# Patient Record
Sex: Male | Born: 1945
Health system: Southern US, Community
[De-identification: ages and names within clinical notes are randomized; demographics above are authoritative.]

## PROBLEM LIST (undated history)

## (undated) DIAGNOSIS — K222 Esophageal obstruction: Secondary | ICD-10-CM

## (undated) DIAGNOSIS — Q893 Situs inversus: Secondary | ICD-10-CM

## (undated) DIAGNOSIS — I499 Cardiac arrhythmia, unspecified: Secondary | ICD-10-CM

## (undated) DIAGNOSIS — K219 Gastro-esophageal reflux disease without esophagitis: Secondary | ICD-10-CM

## (undated) DIAGNOSIS — I4891 Unspecified atrial fibrillation: Secondary | ICD-10-CM

## (undated) DIAGNOSIS — K635 Polyp of colon: Secondary | ICD-10-CM

## (undated) DIAGNOSIS — D369 Benign neoplasm, unspecified site: Secondary | ICD-10-CM

## (undated) DIAGNOSIS — I10 Essential (primary) hypertension: Secondary | ICD-10-CM

## (undated) DIAGNOSIS — E785 Hyperlipidemia, unspecified: Secondary | ICD-10-CM

## (undated) HISTORY — PX: EXTERNAL EAR SURGERY: SHX627

## (undated) HISTORY — PX: TONSILLECTOMY: SUR1361

## (undated) HISTORY — DX: Benign neoplasm, unspecified site: D36.9

## (undated) HISTORY — DX: Unspecified atrial fibrillation: I48.91

## (undated) HISTORY — DX: Esophageal obstruction: K22.2

## (undated) HISTORY — DX: Polyp of colon: K63.5

## (undated) HISTORY — DX: Situs inversus: Q89.3

## (undated) HISTORY — DX: Essential (primary) hypertension: I10

## (undated) HISTORY — DX: Hyperlipidemia, unspecified: E78.5

---

## 2005-03-23 ENCOUNTER — Encounter: Admission: RE | Admit: 2005-03-23 | Discharge: 2005-03-23 | Payer: Self-pay | Admitting: Family Medicine

## 2007-12-10 ENCOUNTER — Ambulatory Visit (HOSPITAL_COMMUNITY): Admission: RE | Admit: 2007-12-10 | Discharge: 2007-12-10 | Payer: Self-pay | Admitting: Pulmonary Disease

## 2007-12-24 ENCOUNTER — Ambulatory Visit (HOSPITAL_COMMUNITY): Admission: RE | Admit: 2007-12-24 | Discharge: 2007-12-24 | Payer: Self-pay | Admitting: Pulmonary Disease

## 2007-12-30 ENCOUNTER — Encounter: Payer: Self-pay | Admitting: Gastroenterology

## 2007-12-30 ENCOUNTER — Ambulatory Visit: Payer: Self-pay | Admitting: Gastroenterology

## 2007-12-30 ENCOUNTER — Ambulatory Visit (HOSPITAL_COMMUNITY): Admission: RE | Admit: 2007-12-30 | Discharge: 2007-12-30 | Payer: Self-pay | Admitting: Gastroenterology

## 2007-12-30 DIAGNOSIS — D369 Benign neoplasm, unspecified site: Secondary | ICD-10-CM

## 2007-12-30 HISTORY — DX: Benign neoplasm, unspecified site: D36.9

## 2008-03-15 ENCOUNTER — Ambulatory Visit: Payer: Self-pay | Admitting: Internal Medicine

## 2008-03-16 ENCOUNTER — Ambulatory Visit: Payer: Self-pay | Admitting: Gastroenterology

## 2008-03-16 ENCOUNTER — Ambulatory Visit (HOSPITAL_COMMUNITY): Admission: RE | Admit: 2008-03-16 | Discharge: 2008-03-16 | Payer: Self-pay | Admitting: Gastroenterology

## 2008-03-16 DIAGNOSIS — K222 Esophageal obstruction: Secondary | ICD-10-CM

## 2008-03-16 HISTORY — DX: Esophageal obstruction: K22.2

## 2008-04-08 ENCOUNTER — Encounter: Payer: Self-pay | Admitting: Gastroenterology

## 2009-10-19 ENCOUNTER — Ambulatory Visit (HOSPITAL_COMMUNITY): Admission: RE | Admit: 2009-10-19 | Discharge: 2009-10-19 | Payer: Self-pay | Admitting: Pulmonary Disease

## 2010-03-05 ENCOUNTER — Encounter: Payer: Self-pay | Admitting: Family Medicine

## 2010-06-27 NOTE — H&P (Signed)
NAME:  Justin Coffey, Justin Coffey NO.:  [id number]   MEDICAL RECORD NO.:  [id number]          PATIENT TYPE:  AMB   LOCATION:  DAY                           FACILITY:  [facility]   PHYSICIAN:  Kassie Mends, M.D.      DATE OF BIRTH:  08-21-1945   DATE OF ADMISSION:  DATE OF DISCHARGE:  LH                              HISTORY & PHYSICAL   Primary gastroenterologist is Dr. Cira Servant.   Primary care physician is Dr. Juanetta Gosling.   CHIEF COMPLAINT:  Globus sensation for 2 months, dysphagia.   HISTORY OF PRESENT ILLNESS:  Justin Coffey a 65 year old Caucasian male.  He has the sense that something is stuck in the back of his throat and  his upper esophagus.  He is having frequent throat clearing. He tells me  he has a lot of mucus.  He was recently diagnosed with bronchitis.  He  has been Prilosec b.i.d. which has made little difference.  He also  sings and tells me that he has had some hoarseness in the last 2 months  as well.  He has a constant a globus sensation.  He denies any heartburn  or indigestion.  He denies any anorexia.  He has had some fatigue.  He  has had solid food dysphagia, especially with foods like chicken. Denies  any problems with liquids.  He feels as if the food gets stuck and  points to his upper esophagus.  He denies any rectal bleeding or melena.  He denies any diarrhea or constipation.   PAST MEDICAL AND SURGICAL HISTORY:  He had a screening colonoscopy by  Dr. Cira Servant on December 30, 2007.  He was found to have a tubular adenoma  removed from the sigmoid colon.  He has a history of situs inversus, ear  graft, tonsillectomy, hypertension, hyperlipidemia, bronchitis.   CURRENT MEDICATIONS:  Omeprazole 20 mg b.i.d., Vytorin 10/20 mg daily,  Cozaar 100 mg daily.   ALLERGIES:  No known drug allergies.   FAMILY HISTORY:  Mother deceased at 36 secondary to lung carcinoma.  Father deceased at 59 secondary to lung carcinoma.  He was a smoker.  He  has two healthy sisters.   There is no known family history of colorectal  carcinoma or lower chronic GI problems.   SOCIAL HISTORY:  Ms. Schwan has been since been married for 40 years.  He has two healthy daughters.  He is retired from [employer].  He  denies any tobacco, alcohol or drug use.   REVIEW OF SYSTEMS:  See HPI.  HEENT: He has had some postnasal drip.  Denies any sore throat.   PHYSICAL EXAMINATION:  VITAL SIGNS: Weight 233 pounds, height 71 inches,  temperature 98.2, blood pressure 140/82, pulse 72.  GENERAL:  He is a well-developed, well-nourished Caucasian male in no  acute distress.  HEENT:  Sclerae are clear, nonicteric.  Conjunctivae are clear.  Oropharynx is pink and moist without any lesions.  NECK:  Supple without adenopathy or thyromegaly.  CHEST: Heart regular rhythm, normal S1, S2.  No murmurs, clicks, rubs or  gallops.  LUNGS:  Clear to auscultation bilaterally.  ABDOMEN:  Positive bowel sounds x4.  No bruits auscultated.  Soft,  nontender, nondistended without palpable masses or hepatosplenomegaly.  No rebound or guarding.  EXTREMITIES:  Without clubbing or edema.   IMPRESSION:  Justin Coffey is a 65 year old Caucasian male with a 15-month  history of globus sensation, frequent throat clearing, solid food  dysphagia.  He is going to require further evaluation to determine if  this can be emanating from gastroesophageal reflux  disease/laryngopharyngeal reflux or a supraesophageal origin.  We do  need to rule out esophageal web ring or stricture as well.   PLAN:  1. EGD with possible esophageal dilatation with Dr. Cira Servant in the near      future. Discussed this procedure including risks and benefits,      which include but are not limited to bleeding, infection,      perforation, drug reaction, and he agrees with plan, and consent      was obtained.  2. Continue b.i.d. Prilosec for now.   Thank you Dr. Juanetta Gosling for allowing [place] to participate in the care of Mr.   Coffey.      Lorenza Burton, N.P.      Kassie Mends, M.D.  Electronically Signed    KJ/MEDQ  D:  03/15/2008  T:  03/15/2008  Job:  [id number]   cc:   Ramon Dredge L. Juanetta Gosling, M.D.  Fax: [phone]

## 2010-06-27 NOTE — Procedures (Signed)
NAME:  Justin Coffey, Justin Coffey NO.:  [id number]   MEDICAL RECORD NO.:  [id number]          PATIENT TYPE:  OUT   LOCATION:  RESP                          FACILITY:  [facility]   PHYSICIAN:  Edward L. Juanetta Gosling, M.D.DATE OF BIRTH:  March 31, 1945   DATE OF PROCEDURE:  DATE OF DISCHARGE:  12/24/2007                            PULMONARY FUNCTION TEST   PULMONARY FUNCTION TEST:  1. Spirometry is normal.  2. Lung volumes are normal.  3. DLCO is normal.  4. Arterial blood gases are normal.      Edward L. Juanetta Gosling, M.D.  Electronically Signed     ELH/MEDQ  D:  12/26/2007  T:  12/26/2007  Job:  [id number]

## 2010-06-27 NOTE — Op Note (Signed)
NAME:  Justin Coffey, BURROUS NO.:  [id number]   MEDICAL RECORD NO.:  [id number]          PATIENT TYPE:  AMB   LOCATION:  DAY                           FACILITY:  [facility]   PHYSICIAN:  Kassie Mends, M.D.      DATE OF BIRTH:  [birth year]   DATE OF PROCEDURE:  DATE OF DISCHARGE:                               OPERATIVE REPORT   PROCEDURE:  Esophagogastroduodenoscopy with Savary dilation to 16 mm.   INDICATION FOR EXAM:  Mr. Feasel is a 65 year old male who has situs  inversus.  He complains of solid dysphagia.  Occasionally, he has to  bring the food back up.   FINDINGS:  1. Distal esophageal ring, which narrowed the lumen to approximately      30 mm.  Otherwise, no evidence of Barrett, mass, erosions, or      ulcerations.  The esophagus was dilated to 16 mm.  2. Normal stomach.  Evidence of situs inversus.  The pylorus is to the      left.  3. Normal duodenal bulb and second portion of the duodenum.   DIAGNOSES:  1. Distal esophageal ring.  2. Situs inversus.   RECOMMENDATIONS:  1. He should continue the omeprazole.  2. No aspirin, NSAIDs, or anticoagulation for 7 days.  3. Followup appointment in 1 month with Dr. Cira Servant regarding his      dysphagia.   MEDICATIONS:  1. Demerol 75 mg IV.  2. Versed 5 mg IV.   PROCEDURE TECHNIQUE:  Physical exam was performed.  Informed consent was  obtained from the patient after explaining the benefits, risks, and  alternatives to the procedure.  The patient was connected to a monitor  and placed in a left lateral position.  Continuous oxygen was provided  by nasal cannula.  IV medicine administered through an indwelling  cannula.  After administration of sedation, the patient's esophagus was  intubated and the scope was advanced under direct visualization to the  second portion of the duodenum.  The scope was removed slowly by  carefully examining the color, texture, anatomy, and integrity of the  mucosa on the way out.  The  patient was  recovered in endoscopy.  Prior to withdrawal of the scope, the Savary  guidewire was introduced.  Each dilator was passed over a wire from 12.8  mm to 16 mm.  The 15-mm and the 16-mm dilator passed with moderate  resistance.  The patient was recovered in endoscopy and discharged home  in satisfactory condition.      Kassie Mends, M.D.  Electronically Signed     SM/MEDQ  D:  03/16/2008  T:  03/16/2008  Job:  [id number]   cc:   Ramon Dredge L. Juanetta Gosling, M.D.  Fax: [phone]

## 2010-06-27 NOTE — Op Note (Signed)
NAME:  Justin Coffey, Justin Coffey NO.:  [id number]   MEDICAL RECORD NO.:  [id number]          PATIENT TYPE:  AMB   LOCATION:  DAY                           FACILITY:  [facility]   PHYSICIAN:  Justin Coffey, M.D.      DATE OF BIRTH:  August 11, 1945   DATE OF PROCEDURE:  12/30/2007  DATE OF DISCHARGE:                               OPERATIVE REPORT   REFERRING PHYSICIAN:  Edward L. Juanetta Gosling, MD   PROCEDURE:  Colonoscopy with cold forceps polypectomy.   INDICATION FOR EXAMINATION:  Justin Coffey is a 65 year old male who  presents with a history of situs inversus.  He reports having a small  polyp 8 years ago in [place].  He does not have any family history of  colon cancer or colon polyps.  He presents for colon cancer screening.   FINDINGS:  1. A 4-mm sessile sigmoid colon polyp removed via cold forceps.      Otherwise, no masses, inflammatory changes, or AVMs seen.  2. Rare sigmoid colon diverticula.  3. Small internal hemorrhoids.  Otherwise, normal retroflexed view of      the rectum.   DIAGNOSES:  1. Small sigmoid colon polyp.  2. Mild sigmoid colon diverticulosis.  3. Small internal hemorrhoids.   RECOMMENDATIONS:  1. Will call Justin Coffey with the results of his biopsies.  If he has      simple adenoma or hyperplastic polyp, then he may have screening      colonoscopy in 5-10 years.  2. He should follow a high-fiber diet.  He was given handout on high-      fiber diet, polyps, diverticulosis, and hemorrhoids.  3. No aspirin, NSAIDs, or anticoagulation for 5 days.   MEDICATIONS:  1. Demerol 75 mg IV.  2. Versed 4 mg IV.   PROCEDURE TECHNIQUE:  Physical exam was performed.  Informed consent was  obtained from the patient explaining the benefits, risks, and  alternatives to the procedure.  The patient was connected to the monitor  and placed in left lateral position.  Continuous oxygen was provided by  nasal cannula and IV medicine administered through an indwelling  cannula.  After administration of sedation and rectal exam, the  patient's rectum was intubated.  The scope was advanced under direct  visualization to the cecum.  The scope was removed slowly by carefully  examining the color, texture, anatomy, and integrity mucosa on the way  out.  The patient was recovered in endoscopy and discharged home in  satisfactory condition.   PATH:  SIMPLE ADENOMA      Justin Coffey, M.D.  Electronically Signed     SM/MEDQ  D:  12/30/2007  T:  12/30/2007  Job:  [id number]   cc:   Justin Coffey, M.D.  Fax: [phone]

## 2010-07-17 ENCOUNTER — Other Ambulatory Visit (HOSPITAL_COMMUNITY): Payer: Self-pay | Admitting: Pulmonary Disease

## 2010-07-17 DIAGNOSIS — R1011 Right upper quadrant pain: Secondary | ICD-10-CM

## 2010-07-18 ENCOUNTER — Ambulatory Visit (HOSPITAL_COMMUNITY)
Admission: RE | Admit: 2010-07-18 | Discharge: 2010-07-18 | Disposition: A | Discharge status: Home / Self Care | Payer: Managed Care, Other (non HMO) | Source: Ambulatory Visit | Attending: Pulmonary Disease | Admitting: Pulmonary Disease

## 2010-07-18 DIAGNOSIS — R1011 Right upper quadrant pain: Secondary | ICD-10-CM

## 2010-07-18 DIAGNOSIS — Q893 Situs inversus: Secondary | ICD-10-CM | POA: Insufficient documentation

## 2010-09-21 ENCOUNTER — Encounter: Payer: Self-pay | Admitting: Urgent Care

## 2010-09-21 ENCOUNTER — Ambulatory Visit (INDEPENDENT_AMBULATORY_CARE_PROVIDER_SITE_OTHER): Payer: Managed Care, Other (non HMO) | Admitting: Urgent Care

## 2010-09-21 DIAGNOSIS — K219 Gastro-esophageal reflux disease without esophagitis: Secondary | ICD-10-CM

## 2010-09-21 DIAGNOSIS — R109 Unspecified abdominal pain: Secondary | ICD-10-CM

## 2010-09-21 NOTE — Assessment & Plan Note (Addendum)
Justin Coffey is a 65 y.o. caucasian male w/ situs inversus totalis with 5 month hx of RUQ abd pain aggravated by eating & exercise.  Hx complicated GERD not on PPI currently.  ABD US->fatty liver & cholelithiasis.  LFTS unknown.  I do not suspect cholelithiasis as the culprit given pain is on opposite side (RUQ).  Differentials include gastritis, poorly controlled GERD, pancreatitis, PUD. Check CBC, LFTS, Amylase, Lipase Resume PPI Consider further imaging via CT scan pending labs & response to PPI.   To ER if severe pain, Offered pain meds, pt declined.

## 2010-09-21 NOTE — Patient Instructions (Signed)
To ER if severe pain Go get labs today  Abdominal Pain Many things can cause belly (abdominal) pain. Most times, the belly pain is not dangerous. The amount of belly pain does not tell how serious the problem may be. Many cases of belly pain can be watched and treated at home. At this time, your doctor believes it is safe for you or your child to go home. HOME CARE  Do not take medicines called laxatives unless told to do so by your doctor. This medicine makes a person go poop (bowel movement).   Only use medicines as told by your doctor.   Limit activity.   Pay attention to the pain.   Has it changed?   Has it moved?   Is it gone?   Eat or drink as told by your doctor. Your doctor will tell you if you or your child should be on a special diet.   Ask questions if there is something you do not understand.  GET HELP RIGHT AWAY IF:  The pain does not go away.   The pain changes and is only in the right or left part of the belly.   You or your child has a temperature by mouth above 101, not controlled by medicine.   Your baby is older than 3 months with a rectal temperature of 102 F (38.9 C) or higher.   Your baby is 75 months old or younger with a rectal temperature of 100.4 F (38 C) or higher.   You or your child keeps throwing up (vomiting) or cannot keep food and fluids down.   There is blood (bright red color) in the poop.   The poop is black and/or looks like tar.  MAKE SURE YOU:  Understand these instructions.   Will watch this condition.   Will get help right away if you or your child is not doing well or gets worse.  Document Released: 07/18/2007 Document Re-Released: 04/25/2009 [facility] Patient Information 2011 [place], [place].

## 2010-09-21 NOTE — Progress Notes (Signed)
Cc to PCP 

## 2010-09-21 NOTE — Progress Notes (Signed)
Primary Care Physician:  Fredirick Maudlin, MD Primary Gastroenterologist:  Dr. Darrick Penna  Chief Complaint  Patient presents with  . Abdominal Pain    RUQ    HPI:  Justin Coffey is a 65 y.o. male here as a work-in today for abdominal pain.  He has hx of SITUS INVERSUS.  5 mo hx RUQ pain.  Intermittent pain, lasts all day long .  Pain almost daily. Worst 3/10.  Pain began after bending over to put on a sock.  Does not wake from sleep.  Worse after eating & movement & exercise.  Occasional nausea.  No vomiting.   Denies any lower GI symptoms including constipation, diarrhea, rectal bleeding, melena or weight loss.  Denies fever or chills.  Wt stable.  Appetite ok.  Denies heartburn or indigestion.  Occasional dysphagia solids.  Better after dilation.  Feels like stitch in side.  Has not tried any meds for this.  ABD [place] 07/18/10->Situs InversusTotalis, Liver/GB in LUQ, cholelithiasis & fatty liver   Past Medical History  Diagnosis Date  . Esophageal ring 03/16/08    s/p esophageal dilation Savary 16mm Dr Darrick Penna   . Colon polyps   . Tubular adenoma 12/30/2007    removed from sigmoid colon  . Situs inversus   . HTN (hypertension)   . Hyperlipemia    Past Surgical History  Procedure Date  . Tonsillectomy   . External ear surgery    Current Outpatient Prescriptions  Medication Sig Dispense Refill  . ezetimibe-simvastatin (VYTORIN) 10-20 MG per tablet Take 1 tablet by mouth at bedtime.        Marland Kitchen losartan (COZAAR) 100 MG tablet Take 100 mg by mouth daily.         Allergies as of 09/21/2010  . (No Known Allergies)   Family History: There is no known family history of colorectal carcinoma , liver disease, or inflammatory bowel disease.  Problem Relation Age of Onset  . Lung cancer Mother   . Lung cancer Father    History   Social History  . Marital Status: Married    Spouse Name: N/A    Number of Children: 2  . Years of Education: N/A   Occupational History  . retired [employer]     Social History Main Topics  . Smoking status: Never Smoker   . Smokeless tobacco: Not on file  . Alcohol Use: No  . Drug Use: No  . Sexually Active: Not on file  Review of Systems: Gen: Denies any fever, chills, sweats, anorexia, fatigue, weakness, malaise, weight loss, and sleep disorder CV: Denies chest pain, angina, palpitations, syncope, orthopnea, PND, peripheral edema, and claudication. Resp: Denies dyspnea at rest, dyspnea with exercise, cough, sputum, wheezing, coughing up blood, and pleurisy. GI: Denies vomiting blood, jaundice, and fecal incontinence.    GU : Denies urinary burning, blood in urine, urinary frequency, urinary hesitancy, nocturnal urination, and urinary incontinence. MS: Denies joint pain, limitation of movement, and swelling, stiffness, low back pain, extremity pain. Denies muscle weakness, cramps, atrophy.  Derm: Denies rash, itching, dry skin, hives, moles, warts, or unhealing ulcers.  Psych: Denies depression, anxiety, memory loss, suicidal ideation, hallucinations, paranoia, and confusion. Heme: Denies bruising, bleeding, and enlarged lymph nodes.  Physical Exam: BP 132/78  Pulse 73  Temp(Src) 98.9 F (37.2 C) (Temporal)  Ht 5\' 11"  (1.803 m)  Wt 232 lb (105.235 kg)  BMI 32.36 kg/m2 General:   Alert,  Well-developed, well-nourished, pleasant and cooperative in NAD Head:  Normocephalic and  atraumatic. Eyes:  Sclera clear, no icterus.   Conjunctiva pink. Ears:  Normal auditory acuity. Nose:  No deformity, discharge,  or lesions. Mouth:  No deformity or lesions, dentition normal. Neck:  Supple; no masses or thyromegaly. Lungs:  Clear throughout to auscultation.   No wheezes, crackles, or rhonchi. No acute distress. Heart:  Regular rate and rhythm; no murmurs, clicks, rubs,  or gallops. Abdomen:  Soft, nontender and nondistended. No masses, hepatosplenomegaly or hernias noted. Normal bowel sounds, without guarding, and without rebound.  Negative  Carnett. Rectal:  Deferred  Msk:  Symmetrical without gross deformities. Normal posture. Pulses:  Normal pulses noted. Extremities:  Without clubbing or edema. Neurologic:  Alert and  oriented x4;  grossly normal neurologically. Skin:  Intact without significant lesions or rashes. Cervical Nodes:  No significant cervical adenopathy. Psych:  Alert and cooperative. Normal mood and affect.

## 2010-09-22 LAB — CBC WITH DIFFERENTIAL/PLATELET
Basophils Absolute: 0 10*3/uL (ref 0.0–0.1)
Basophils Relative: 1 % (ref 0–1)
HCT: 46.7 % (ref 39.0–52.0)
Lymphocytes Relative: 25 % (ref 12–46)
Lymphs Abs: 2 10*3/uL (ref 0.7–4.0)
MCH: 29.9 pg (ref 26.0–34.0)
MCV: 90 fL (ref 78.0–100.0)
Monocytes Absolute: 1.2 10*3/uL — ABNORMAL HIGH (ref 0.1–1.0)
Monocytes Relative: 15 % — ABNORMAL HIGH (ref 3–12)
Neutro Abs: 4.5 10*3/uL (ref 1.7–7.7)
Neutrophils Relative %: 56 % (ref 43–77)
Platelets: 180 10*3/uL (ref 150–400)
RBC: 5.19 MIL/uL (ref 4.22–5.81)

## 2010-09-22 LAB — HEPATIC FUNCTION PANEL
ALT: 27 U/L (ref 0–53)
AST: 25 U/L (ref 0–37)
Albumin: 4.4 g/dL (ref 3.5–5.2)

## 2010-09-22 LAB — CREATININE, SERUM: Creat: 1.2 mg/dL (ref 0.50–1.35)

## 2010-09-22 LAB — AMYLASE: Amylase: 37 U/L (ref 0–105)

## 2010-09-22 NOTE — Progress Notes (Signed)
MOST LIKELY MUSCULOSKELETAL ABD WALL PAIN or functional pain. Waiting on labs. No acute indication for CT Scan

## 2010-09-25 ENCOUNTER — Telehealth: Payer: Self-pay | Admitting: Urgent Care

## 2010-09-25 MED ORDER — OMEPRAZOLE 20 MG PO TBEC: 20.0000 mg | DELAYED_RELEASE_TABLET | Freq: Every day | ORAL | Status: DC

## 2010-09-25 NOTE — Telephone Encounter (Signed)
Message copied by Joselyn Arrow on Mon Sep 25, 2010 10:03 AM ------      Message from: Cloria Spring H      Created: Mon Sep 25, 2010  7:59 AM       Routing back to Lorenza Burton, NP per her request.

## 2010-09-25 NOTE — Telephone Encounter (Signed)
Discussed results w/ pt. Reassured. Agrees w/ plan. Trial omeprazole. Will call if no relief for CT/further work-up.

## 2010-09-28 ENCOUNTER — Ambulatory Visit: Payer: Managed Care, Other (non HMO) | Admitting: Gastroenterology

## 2010-09-28 ENCOUNTER — Telehealth: Payer: Self-pay

## 2010-09-28 NOTE — Telephone Encounter (Signed)
Pt called. He had not received his prescription for Omeprazole. It had been sent to [facility], and I called and spoke with Raquel and cancelled it. Called it to [facility] and give the order to Tammy. Omeprazole 20 mg One tablet daily with 5 refills from Lorenza Burton, NP.

## 2010-11-14 LAB — BLOOD GAS, ARTERIAL

## 2011-06-04 DIAGNOSIS — C4491 Basal cell carcinoma of skin, unspecified: Secondary | ICD-10-CM

## 2011-06-04 HISTORY — DX: Basal cell carcinoma of skin, unspecified: C44.91

## 2011-08-21 ENCOUNTER — Telehealth: Payer: Self-pay | Admitting: Gastroenterology

## 2011-08-21 NOTE — Telephone Encounter (Signed)
Pt's wife called to cancel OV with SF in August. He was going to follow up with PCP.

## 2011-08-21 NOTE — Telephone Encounter (Signed)
I called pt and spoke with his wife. She said that he has had a sore throat, and requesting a Z-Pak. She said he has already taken an antibiotic from PCP. I asked if he was having any difficulty with eating, swallowing etc. She said no. I told her that Dr. Darrick Penna does not prescribe antibiotics for ST that he should see his PCP. She said he has made an appt to see Dr. Darrick Penna in August and he will keep it at this time. She said that he is just concerned because he gets a little hoarse and can't seem to get rid of the ST. They will call the PCP for appt now, and plan on keeping the Aug appt. Here.

## 2011-08-21 NOTE — Telephone Encounter (Signed)
REVIEWED.  

## 2011-08-21 NOTE — Telephone Encounter (Signed)
Pt called to make OV with SF only. OV is 8/14. Pt called back again wanting to know if SF would call in a prescription to [facility] for a Z pack since he has to wait so long to be seen. Please advise and he can be reached at [phone]

## 2011-09-26 ENCOUNTER — Ambulatory Visit: Payer: Managed Care, Other (non HMO) | Admitting: Gastroenterology

## 2012-01-25 ENCOUNTER — Other Ambulatory Visit (HOSPITAL_COMMUNITY): Payer: Self-pay | Admitting: Pulmonary Disease

## 2012-01-25 DIAGNOSIS — R109 Unspecified abdominal pain: Secondary | ICD-10-CM

## 2012-01-30 ENCOUNTER — Ambulatory Visit (HOSPITAL_COMMUNITY)
Admission: RE | Admit: 2012-01-30 | Discharge: 2012-01-30 | Disposition: A | Discharge status: Home / Self Care | Payer: Managed Care, Other (non HMO) | Source: Ambulatory Visit | Attending: Pulmonary Disease | Admitting: Pulmonary Disease

## 2012-01-30 DIAGNOSIS — K8020 Calculus of gallbladder without cholecystitis without obstruction: Secondary | ICD-10-CM | POA: Insufficient documentation

## 2012-01-30 DIAGNOSIS — R109 Unspecified abdominal pain: Secondary | ICD-10-CM

## 2012-01-30 DIAGNOSIS — Q893 Situs inversus: Secondary | ICD-10-CM | POA: Insufficient documentation

## 2012-08-12 ENCOUNTER — Other Ambulatory Visit (HOSPITAL_COMMUNITY): Payer: Self-pay | Admitting: Pulmonary Disease

## 2012-08-12 DIAGNOSIS — T6701XA Heatstroke and sunstroke, initial encounter: Secondary | ICD-10-CM

## 2012-08-13 ENCOUNTER — Ambulatory Visit (HOSPITAL_COMMUNITY)
Admission: RE | Admit: 2012-08-13 | Discharge: 2012-08-13 | Disposition: A | Discharge status: Home / Self Care | Payer: Managed Care, Other (non HMO) | Source: Ambulatory Visit | Attending: Pulmonary Disease | Admitting: Pulmonary Disease

## 2012-08-13 DIAGNOSIS — R51 Headache: Secondary | ICD-10-CM | POA: Insufficient documentation

## 2012-08-13 DIAGNOSIS — R42 Dizziness and giddiness: Secondary | ICD-10-CM | POA: Insufficient documentation

## 2012-08-13 DIAGNOSIS — T6701XA Heatstroke and sunstroke, initial encounter: Secondary | ICD-10-CM

## 2012-09-17 ENCOUNTER — Other Ambulatory Visit: Payer: Self-pay | Admitting: Dermatology

## 2012-12-19 ENCOUNTER — Other Ambulatory Visit (HOSPITAL_COMMUNITY): Payer: Self-pay | Admitting: Pulmonary Disease

## 2012-12-19 ENCOUNTER — Ambulatory Visit (HOSPITAL_COMMUNITY)
Admission: RE | Admit: 2012-12-19 | Discharge: 2012-12-19 | Disposition: A | Discharge status: Home / Self Care | Payer: Managed Care, Other (non HMO) | Source: Ambulatory Visit | Attending: Pulmonary Disease | Admitting: Pulmonary Disease

## 2012-12-19 ENCOUNTER — Other Ambulatory Visit: Payer: Self-pay | Admitting: Orthopedic Surgery

## 2012-12-19 DIAGNOSIS — Q893 Situs inversus: Secondary | ICD-10-CM | POA: Insufficient documentation

## 2012-12-19 DIAGNOSIS — M25519 Pain in unspecified shoulder: Secondary | ICD-10-CM | POA: Insufficient documentation

## 2012-12-19 DIAGNOSIS — R079 Chest pain, unspecified: Secondary | ICD-10-CM

## 2012-12-19 DIAGNOSIS — M25512 Pain in left shoulder: Secondary | ICD-10-CM

## 2012-12-19 DIAGNOSIS — M546 Pain in thoracic spine: Secondary | ICD-10-CM | POA: Insufficient documentation

## 2013-01-06 ENCOUNTER — Encounter (HOSPITAL_BASED_OUTPATIENT_CLINIC_OR_DEPARTMENT_OTHER): Payer: Self-pay | Admitting: *Deleted

## 2013-01-06 NOTE — Progress Notes (Signed)
Had recent lab-will see if ok

## 2013-01-07 NOTE — Progress Notes (Signed)
Dr fitzgerald oked 12/10/12 labs-and waved ekg

## 2013-01-14 ENCOUNTER — Encounter (HOSPITAL_BASED_OUTPATIENT_CLINIC_OR_DEPARTMENT_OTHER)
Admission: RE | Disposition: A | Discharge status: Home / Self Care | Payer: Self-pay | Source: Ambulatory Visit | Attending: Orthopedic Surgery

## 2013-01-14 ENCOUNTER — Ambulatory Visit (HOSPITAL_BASED_OUTPATIENT_CLINIC_OR_DEPARTMENT_OTHER)
Admission: RE | Admit: 2013-01-14 | Discharge: 2013-01-14 | Disposition: A | Discharge status: Home / Self Care | Payer: Medicare Other | Source: Ambulatory Visit | Attending: Orthopedic Surgery | Admitting: Orthopedic Surgery

## 2013-01-14 ENCOUNTER — Encounter (HOSPITAL_BASED_OUTPATIENT_CLINIC_OR_DEPARTMENT_OTHER): Payer: Self-pay | Admitting: *Deleted

## 2013-01-14 ENCOUNTER — Encounter (HOSPITAL_BASED_OUTPATIENT_CLINIC_OR_DEPARTMENT_OTHER): Payer: Medicare Other | Admitting: *Deleted

## 2013-01-14 ENCOUNTER — Ambulatory Visit (HOSPITAL_BASED_OUTPATIENT_CLINIC_OR_DEPARTMENT_OTHER): Payer: Medicare Other | Admitting: *Deleted

## 2013-01-14 DIAGNOSIS — M65839 Other synovitis and tenosynovitis, unspecified forearm: Secondary | ICD-10-CM | POA: Diagnosis not present

## 2013-01-14 DIAGNOSIS — M6530 Trigger finger, unspecified finger: Secondary | ICD-10-CM | POA: Insufficient documentation

## 2013-01-14 DIAGNOSIS — E785 Hyperlipidemia, unspecified: Secondary | ICD-10-CM | POA: Diagnosis not present

## 2013-01-14 DIAGNOSIS — I10 Essential (primary) hypertension: Secondary | ICD-10-CM | POA: Insufficient documentation

## 2013-01-14 HISTORY — PX: TRIGGER FINGER RELEASE: SHX641

## 2013-01-14 SURGERY — RELEASE, A1 PULLEY, FOR TRIGGER FINGER
Anesthesia: Regional | Site: Finger | Laterality: Left

## 2013-01-14 MED ORDER — LIDOCAINE HCL (PF) 0.5 % IJ SOLN
INTRAMUSCULAR | Status: DC | PRN
  Administered 2013-01-14: 35 mL via INTRAVENOUS

## 2013-01-14 MED ORDER — LACTATED RINGERS IV SOLN
INTRAVENOUS | Status: DC
  Administered 2013-01-14: 09:00:00 via INTRAVENOUS

## 2013-01-14 MED ORDER — HYDROMORPHONE HCL PF 1 MG/ML IJ SOLN: 0.2500 mg | INTRAMUSCULAR | Status: DC | PRN

## 2013-01-14 MED ORDER — FENTANYL CITRATE 0.05 MG/ML IJ SOLN: 50.0000 ug | INTRAMUSCULAR | Status: DC | PRN

## 2013-01-14 MED ORDER — MIDAZOLAM HCL 2 MG/2ML IJ SOLN
INTRAMUSCULAR | Status: AC
  Filled 2013-01-14: qty 2

## 2013-01-14 MED ORDER — HYDROCODONE-ACETAMINOPHEN 5-325 MG PO TABS: 1.0000 | ORAL_TABLET | Freq: Four times a day (QID) | ORAL | Status: DC | PRN

## 2013-01-14 MED ORDER — MIDAZOLAM HCL 5 MG/5ML IJ SOLN
INTRAMUSCULAR | Status: DC | PRN
  Administered 2013-01-14: 2 mg via INTRAVENOUS

## 2013-01-14 MED ORDER — OXYCODONE HCL 5 MG PO TABS: 5.0000 mg | ORAL_TABLET | Freq: Once | ORAL | Status: DC | PRN

## 2013-01-14 MED ORDER — BUPIVACAINE HCL (PF) 0.25 % IJ SOLN
INTRAMUSCULAR | Status: AC
  Filled 2013-01-14: qty 30

## 2013-01-14 MED ORDER — LIDOCAINE HCL (CARDIAC) 20 MG/ML IV SOLN
INTRAVENOUS | Status: DC | PRN
  Administered 2013-01-14: 25 mg via INTRAVENOUS

## 2013-01-14 MED ORDER — OXYCODONE HCL 5 MG/5ML PO SOLN: 5.0000 mg | Freq: Once | ORAL | Status: DC | PRN

## 2013-01-14 MED ORDER — CEFAZOLIN SODIUM-DEXTROSE 2-3 GM-% IV SOLR
2.0000 g | INTRAVENOUS | Status: AC
  Administered 2013-01-14: 2 g via INTRAVENOUS

## 2013-01-14 MED ORDER — CHLORHEXIDINE GLUCONATE 4 % EX LIQD: 60.0000 mL | Freq: Once | CUTANEOUS | Status: DC

## 2013-01-14 MED ORDER — FENTANYL CITRATE 0.05 MG/ML IJ SOLN
INTRAMUSCULAR | Status: DC | PRN
  Administered 2013-01-14 (×2): 50 ug via INTRAVENOUS

## 2013-01-14 MED ORDER — MIDAZOLAM HCL 2 MG/2ML IJ SOLN: 1.0000 mg | INTRAMUSCULAR | Status: DC | PRN

## 2013-01-14 MED ORDER — ONDANSETRON HCL 4 MG/2ML IJ SOLN: 4.0000 mg | Freq: Once | INTRAMUSCULAR | Status: DC | PRN

## 2013-01-14 MED ORDER — FENTANYL CITRATE 0.05 MG/ML IJ SOLN
INTRAMUSCULAR | Status: AC
  Filled 2013-01-14: qty 4

## 2013-01-14 MED ORDER — FENTANYL CITRATE 0.05 MG/ML IJ SOLN
INTRAMUSCULAR | Status: AC
  Filled 2013-01-14: qty 2

## 2013-01-14 MED ORDER — PROPOFOL INFUSION 10 MG/ML OPTIME
INTRAVENOUS | Status: DC | PRN
  Administered 2013-01-14: 100 ug/kg/min via INTRAVENOUS

## 2013-01-14 MED ORDER — BUPIVACAINE HCL (PF) 0.25 % IJ SOLN
INTRAMUSCULAR | Status: DC | PRN
  Administered 2013-01-14: 7 mL

## 2013-01-14 MED ORDER — MEPERIDINE HCL 25 MG/ML IJ SOLN: 6.2500 mg | INTRAMUSCULAR | Status: DC | PRN

## 2013-01-14 SURGICAL SUPPLY — 34 items
BANDAGE COBAN STERILE 2 (GAUZE/BANDAGES/DRESSINGS) ×2 IMPLANT
BLADE SURG 15 STRL LF DISP TIS (BLADE) ×1 IMPLANT
BLADE SURG 15 STRL SS (BLADE) ×1
BNDG ESMARK 4X9 LF (GAUZE/BANDAGES/DRESSINGS) IMPLANT
CHLORAPREP W/TINT 26ML (MISCELLANEOUS) ×2 IMPLANT
CORDS BIPOLAR (ELECTRODE) IMPLANT
COVER MAYO STAND STRL (DRAPES) ×2 IMPLANT
COVER TABLE BACK 60X90 (DRAPES) ×2 IMPLANT
CUFF TOURNIQUET SINGLE 18IN (TOURNIQUET CUFF) ×2 IMPLANT
DECANTER SPIKE VIAL GLASS SM (MISCELLANEOUS) IMPLANT
DRAPE EXTREMITY T 121X128X90 (DRAPE) ×2 IMPLANT
DRAPE SURG 17X23 STRL (DRAPES) ×2 IMPLANT
GAUZE XEROFORM 1X8 LF (GAUZE/BANDAGES/DRESSINGS) ×2 IMPLANT
GLOVE BIOGEL PI IND STRL 7.0 (GLOVE) ×1 IMPLANT
GLOVE BIOGEL PI IND STRL 8.5 (GLOVE) ×1 IMPLANT
GLOVE BIOGEL PI INDICATOR 7.0 (GLOVE) ×1
GLOVE BIOGEL PI INDICATOR 8.5 (GLOVE) ×1
GLOVE ECLIPSE 6.5 STRL STRAW (GLOVE) ×2 IMPLANT
GLOVE EXAM NITRILE LRG STRL (GLOVE) ×2 IMPLANT
GLOVE SURG ORTHO 8.0 STRL STRW (GLOVE) ×2 IMPLANT
GOWN BRE IMP PREV XXLGXLNG (GOWN DISPOSABLE) ×2 IMPLANT
GOWN PREVENTION PLUS XLARGE (GOWN DISPOSABLE) ×2 IMPLANT
NEEDLE 27GAX1X1/2 (NEEDLE) ×2 IMPLANT
NS IRRIG 1000ML POUR BTL (IV SOLUTION) ×2 IMPLANT
PACK BASIN DAY SURGERY FS (CUSTOM PROCEDURE TRAY) ×2 IMPLANT
PADDING CAST ABS 4INX4YD NS (CAST SUPPLIES)
PADDING CAST ABS COTTON 4X4 ST (CAST SUPPLIES) IMPLANT
SPONGE GAUZE 4X4 12PLY (GAUZE/BANDAGES/DRESSINGS) ×2 IMPLANT
STOCKINETTE 4X48 STRL (DRAPES) ×2 IMPLANT
SUT VICRYL RAPIDE 4/0 PS 2 (SUTURE) ×2 IMPLANT
SYR BULB 3OZ (MISCELLANEOUS) ×2 IMPLANT
SYR CONTROL 10ML LL (SYRINGE) ×2 IMPLANT
TOWEL OR 17X24 6PK STRL BLUE (TOWEL DISPOSABLE) ×2 IMPLANT
UNDERPAD 30X30 INCONTINENT (UNDERPADS AND DIAPERS) ×2 IMPLANT

## 2013-01-14 NOTE — Transfer of Care (Signed)
Immediate Anesthesia Transfer of Care Note  Patient: Justin Coffey  Procedure(s) Performed: Procedure(s): RELEASE A-1 PULLEY LEFT THUMB,MIDDLE AND RING FINGER (Left)  Patient Location: PACU  Anesthesia Type:MAC  Level of Consciousness: awake, alert , oriented and patient cooperative  Airway & Oxygen Therapy: Patient Spontanous Breathing and Patient connected to face mask oxygen  Post-op Assessment: Report given to PACU RN and Post -op Vital signs reviewed and unstable, Anesthesiologist notified  Post vital signs: Reviewed and stable  Complications: No apparent anesthesia complications

## 2013-01-14 NOTE — Anesthesia Preprocedure Evaluation (Addendum)
Anesthesia Evaluation  Patient identified by MRN, date of birth, ID band Patient awake    Reviewed: Allergy & Precautions, H&P , NPO status , Patient's Chart, lab work & pertinent test results  Airway Mallampati: I TM Distance: >3 FB Neck ROM: Full    Dental   Pulmonary          Cardiovascular hypertension, Pt. on medications     Neuro/Psych    GI/Hepatic GERD-  Controlled and Medicated,  Endo/Other    Renal/GU      Musculoskeletal   Abdominal   Peds  Hematology   Anesthesia Other Findings   Reproductive/Obstetrics                          Anesthesia Physical Anesthesia Plan  ASA: II  Anesthesia Plan: Bier Block   Post-op Pain Management:    Induction: Intravenous  Airway Management Planned: Simple Face Mask  Additional Equipment:   Intra-op Plan:   Post-operative Plan:   Informed Consent: I have reviewed the patients History and Physical, chart, labs and discussed the procedure including the risks, benefits and alternatives for the proposed anesthesia with the patient or authorized representative who has indicated his/her understanding and acceptance.     Plan Discussed with: CRNA and Surgeon  Anesthesia Plan Comments:        Anesthesia Quick Evaluation

## 2013-01-14 NOTE — Anesthesia Postprocedure Evaluation (Signed)
Anesthesia Post Note  Patient: Justin Coffey  Procedure(s) Performed: Procedure(s) (LRB): RELEASE A-1 PULLEY LEFT THUMB,MIDDLE AND RING FINGER (Left)  Anesthesia type: general  Patient location: PACU  Post pain: Pain level controlled  Post assessment: Patient's Cardiovascular Status Stable  Last Vitals:  Filed Vitals:   01/14/13 1031  BP: 160/85  Pulse: 51  Temp: 36.6 C  Resp: 16    Post vital signs: Reviewed and stable  Level of consciousness: sedated  Complications: No apparent anesthesia complications

## 2013-01-14 NOTE — Brief Op Note (Signed)
01/14/2013  9:44 AM  PATIENT:  Stoney Bang  67 y.o. male  PRE-OPERATIVE DIAGNOSIS:  STENOSING TENOSYNOVITIS LEFT THUMB,MIDDLE AND RING FINGER  POST-OPERATIVE DIAGNOSIS:  STENOSING TENOSYNOVITIS LEFT THUMB,MIDDLE AND RING FINGER  PROCEDURE:  Procedure(s): RELEASE A-1 PULLEY LEFT THUMB,MIDDLE AND RING FINGER (Left)  SURGEON:  Surgeon(s) and Role:    * Nicki Reaper, MD - Primary  PHYSICIAN ASSISTANT:   ASSISTANTS: none   ANESTHESIA:   local and regional  EBL:  Total I/O In: 100 [I.V.:100] Out: -   BLOOD ADMINISTERED:none  DRAINS: none   LOCAL MEDICATIONS USED:  BUPIVICAINE   SPECIMEN:  No Specimen  DISPOSITION OF SPECIMEN:  N/A  COUNTS:  YES  TOURNIQUET:   Total Tourniquet Time Documented: Forearm (Left) - 26 minutes Total: Forearm (Left) - 26 minutes   DICTATION: .Other Dictation: Dictation Number [id number]  PLAN OF CARE: Discharge to home after PACU  PATIENT DISPOSITION:  PACU - hemodynamically stable.

## 2013-01-14 NOTE — Op Note (Signed)
Dictation Number [id number]

## 2013-01-14 NOTE — H&P (Signed)
Justin Coffey is a 67 year old left hand dominant male who comes in complaining of catching of his left thumb. This has been going on for approximately one month. It is causing some discomfort. No history of diabetes, thyroid problems, arthritis or gout. He has no history of injury. He states he has a similar problem on his right hand middle and ring fingers. He states it is worse in the morning and improves during the day. He has not had any treatment for it. He has tried taking Aleve without any significant relief. He states it has gotten somewhat better. He has not had anything done to his right hand.  PAST MEDICAL HISTORY: He has no known drug allergies. He is on Omeprazole, Atorvastatin, and Losartan. He has had a tonsillectomy, skin graft to the ear drum.  FAMILY H ISTORY: Positive for high BP.  SOCIAL HISTORY: He does not smoke or drink. He is married.  REVIEW OF SYSTEMS: Positive for high BP, otherwise negative. Justin Coffey is an 67 y.o. male.   Chief Complaint: STS left thumb, middle, ring  HPI: see above  Past Medical History  Diagnosis Date  . Esophageal ring 03/16/08    s/p esophageal dilation Savary 16mm Dr Darrick Penna   . Colon polyps   . Tubular adenoma 12/30/2007    removed from sigmoid colon  . Situs inversus   . HTN (hypertension)   . Hyperlipemia     Past Surgical History  Procedure Laterality Date  . Tonsillectomy    . External ear surgery      Family History  Problem Relation Age of Onset  . Lung cancer Mother   . Lung cancer Father    Social History:  reports that he has never smoked. He does not have any smokeless tobacco history on file. He reports that he does not drink alcohol or use illicit drugs.  Allergies: No Known Allergies  No prescriptions prior to admission    No results found for this or any previous visit (from the past 48 hour(s)).  No results found.   Pertinent items are noted in HPI.  Height 5\' 11"  (1.803 m), weight 225 lb  (102.059 kg).  General appearance: alert, cooperative and appears stated age Head: Normocephalic, without obvious abnormality Neck: no JVD Resp: clear to auscultation bilaterally Cardio: regular rate and rhythm, S1, S2 normal, no murmur, click, rub or gallop GI: soft, non-tender; bowel sounds normal; no masses,  no organomegaly Extremities: extremities normal, atraumatic, no cyanosis or edema Pulses: 2+ and symmetric Skin: Skin color, texture, turgor normal. No rashes or lesions Neurologic: Grossly normal Incision/Wound: na  Assessment/Plan Diagnosis: STS left thumb and ring finger. This has been injected on 2 occasions but continues to trigger. He has also developed triggering of his middle and ring on that hand. In that we would recommend surgical intervention to the thumb we would recommend release of the A-1 pulleys of the middle and ring also. The pre, peri and post op course are discussed along with risks and complications.  He is aware there is no guarantee with surgery, possibility of infection, recurrence, injury to arteries, nerves and tendons, incomplete relief of symptoms and dystrophy.  He is scheduled at his convenience for left thumb, middle and ring finger A-1 pulley releases.  Justin Coffey 01/14/2013, 7:39 AM

## 2013-01-14 NOTE — Anesthesia Procedure Notes (Signed)
Procedure Name: MAC Date/Time: 01/14/2013 9:14 AM Performed by: Nicki Reaper Pre-anesthesia Checklist: Patient identified, Emergency Drugs available, Suction available and Patient being monitored Patient Re-evaluated:Patient Re-evaluated prior to inductionOxygen Delivery Method: Simple face mask Preoxygenation: Pre-oxygenation with 100% oxygen Intubation Type: IV induction

## 2013-01-15 NOTE — Op Note (Signed)
NAMEMarland Coffey  ANDRA, MATSUO NO.:  [id number]  MEDICAL RECORD NO.:  [id number]  LOCATION:                                 FACILITY:  PHYSICIAN:  Cindee Salt, M.D.       DATE OF BIRTH:  24-Nov-1945  DATE OF PROCEDURE:  01/14/2013 DATE OF DISCHARGE:                              OPERATIVE REPORT   PREOPERATIVE DIAGNOSIS:  Stenosing tenosynovitis, left thumb, left middle and left ring fingers.  POSTOPERATIVE DIAGNOSIS:  Stenosing tenosynovitis, left thumb, left middle and left ring fingers.  OPERATION:  Release A1 pulley; left thumb, left middle, and left ring finger.  SURGEON:  Cindee Salt, MD  ANESTHESIA:  Forearm-based IV regional with local infiltration.  HISTORY:  The patient is a 67 year old male with a history of triggering of his thumb and ring fingers.  He has also recently developed triggering of his middle and is desirous having not released also.  He is aware of risks and complications including infection; recurrence of injury to arteries, nerves, tendons, incomplete relief of symptoms, dystrophy.  In the preoperative area, the patient is seen, the extremity marked by both patient and surgeon.  Antibiotic given.  PROCEDURE IN DETAIL:  The patient was brought to the operating room where a forearm-based IV regional anesthetic was carried out without difficulty.  He was prepped using ChloraPrep, supine position with the left arm free.  A 3-minute dry time was allowed.  Time-out taken, confirming the patient and procedure.  After adequate anesthesia was afforded.  An oblique incision was made over the ring finger.  A1 pulley, carried down through subcutaneous tissue.  Bleeders were electrocauterized with bipolar.  Retractors were placed protecting neurovascular bundles.  Significant thickening of the A1 pulley was noted with a moderate tenosynovitis proximally.  The A1 pulley was released on its radial aspect and small incision made centrally in A2 and  partial tenosynovectomy performed proximally.  Finger was placed through a full range motion, no further triggering was noted.  An oblique incision was then made over the middle finger, left hand and carried down through subcutaneous tissue.  Again bleeders were electrocauterized with bipolar.  Dissection carried down to the A1 pulley.  The A1 pulley was released on its radial aspect and again found to be markedly thickened with moderate tenosynovitis proximally.  This was partially resected.  A small incision made centrally in A2, the finger was placed through full range motion and no further triggering was noted.  All retractors were placed to protect neurovascular structures throughout the procedure.  A transverse incision was made over the A1 pulley of the left thumb, and carried down through subcutaneous tissue.  Retractors were placed protecting neurovascular bundles.  Again, thickening of the A1 pulley was noted.  The A1 pulley was released on its radial aspect protecting the oblique pulley distally.  A digital nerve and artery were protected throughout the procedure.  A partial tenosynovectomy was performed with blunt dissection proximally.  The thumb was placed through a full range motion and node was noted and no further triggering was noted.  The wounds were irrigated.  Each closed separately with interrupted 4-0 Vicryl Rapide sutures.  Local  infiltration with 0.25% Marcaine without epinephrine was given at the beginning of the procedure to each of the areas of incision, 8.5 mL was used.  A sterile compressive dressing with fingers and thumb free was applied.  On deflation of the tourniquet, all fingers immediately pinked.  He was taken to the recovery room for observation in satisfactory condition.  He will be discharged home and to return in 1 week on Vicodin.          ______________________________ Cindee Salt, M.D.     GK/MEDQ  D:  01/14/2013  T:  01/15/2013  Job:   [id number]

## 2013-01-19 ENCOUNTER — Encounter (HOSPITAL_BASED_OUTPATIENT_CLINIC_OR_DEPARTMENT_OTHER): Payer: Self-pay | Admitting: Orthopedic Surgery

## 2013-04-03 DIAGNOSIS — H43399 Other vitreous opacities, unspecified eye: Secondary | ICD-10-CM | POA: Diagnosis not present

## 2013-04-03 DIAGNOSIS — H4310 Vitreous hemorrhage, unspecified eye: Secondary | ICD-10-CM | POA: Diagnosis not present

## 2013-04-17 DIAGNOSIS — H43399 Other vitreous opacities, unspecified eye: Secondary | ICD-10-CM | POA: Diagnosis not present

## 2013-04-17 DIAGNOSIS — H4310 Vitreous hemorrhage, unspecified eye: Secondary | ICD-10-CM | POA: Diagnosis not present

## 2013-08-08 DIAGNOSIS — Z6832 Body mass index (BMI) 32.0-32.9, adult: Secondary | ICD-10-CM | POA: Diagnosis not present

## 2013-08-08 DIAGNOSIS — Z23 Encounter for immunization: Secondary | ICD-10-CM | POA: Diagnosis not present

## 2013-08-08 DIAGNOSIS — T148 Other injury of unspecified body region: Secondary | ICD-10-CM | POA: Diagnosis not present

## 2013-11-09 DIAGNOSIS — Z23 Encounter for immunization: Secondary | ICD-10-CM | POA: Diagnosis not present

## 2013-12-20 DIAGNOSIS — J029 Acute pharyngitis, unspecified: Secondary | ICD-10-CM | POA: Diagnosis not present

## 2014-01-11 DIAGNOSIS — E6609 Other obesity due to excess calories: Secondary | ICD-10-CM | POA: Diagnosis not present

## 2014-01-11 DIAGNOSIS — Z6831 Body mass index (BMI) 31.0-31.9, adult: Secondary | ICD-10-CM | POA: Diagnosis not present

## 2014-01-11 DIAGNOSIS — Z23 Encounter for immunization: Secondary | ICD-10-CM | POA: Diagnosis not present

## 2014-01-11 DIAGNOSIS — R7309 Other abnormal glucose: Secondary | ICD-10-CM | POA: Diagnosis not present

## 2014-01-11 DIAGNOSIS — E782 Mixed hyperlipidemia: Secondary | ICD-10-CM | POA: Diagnosis not present

## 2014-01-11 DIAGNOSIS — I10 Essential (primary) hypertension: Secondary | ICD-10-CM | POA: Diagnosis not present

## 2014-01-11 DIAGNOSIS — K219 Gastro-esophageal reflux disease without esophagitis: Secondary | ICD-10-CM | POA: Diagnosis not present

## 2014-01-11 DIAGNOSIS — Z0000 Encounter for general adult medical examination without abnormal findings: Secondary | ICD-10-CM | POA: Diagnosis not present

## 2014-01-11 DIAGNOSIS — Z125 Encounter for screening for malignant neoplasm of prostate: Secondary | ICD-10-CM | POA: Diagnosis not present

## 2014-01-12 DIAGNOSIS — R7309 Other abnormal glucose: Secondary | ICD-10-CM | POA: Diagnosis not present

## 2014-02-02 DIAGNOSIS — J312 Chronic pharyngitis: Secondary | ICD-10-CM | POA: Diagnosis not present

## 2014-02-02 DIAGNOSIS — Z6831 Body mass index (BMI) 31.0-31.9, adult: Secondary | ICD-10-CM | POA: Diagnosis not present

## 2014-03-08 DIAGNOSIS — K449 Diaphragmatic hernia without obstruction or gangrene: Secondary | ICD-10-CM | POA: Diagnosis not present

## 2014-03-08 DIAGNOSIS — K219 Gastro-esophageal reflux disease without esophagitis: Secondary | ICD-10-CM | POA: Diagnosis not present

## 2014-03-08 DIAGNOSIS — K210 Gastro-esophageal reflux disease with esophagitis: Secondary | ICD-10-CM | POA: Diagnosis not present

## 2014-03-08 DIAGNOSIS — D1339 Benign neoplasm of other parts of small intestine: Secondary | ICD-10-CM | POA: Diagnosis not present

## 2014-03-08 DIAGNOSIS — D132 Benign neoplasm of duodenum: Secondary | ICD-10-CM | POA: Diagnosis not present

## 2014-03-08 DIAGNOSIS — K2900 Acute gastritis without bleeding: Secondary | ICD-10-CM | POA: Diagnosis not present

## 2014-03-08 DIAGNOSIS — D131 Benign neoplasm of stomach: Secondary | ICD-10-CM | POA: Diagnosis not present

## 2014-03-08 DIAGNOSIS — K2980 Duodenitis without bleeding: Secondary | ICD-10-CM | POA: Diagnosis not present

## 2014-03-08 DIAGNOSIS — D130 Benign neoplasm of esophagus: Secondary | ICD-10-CM | POA: Diagnosis not present

## 2014-03-17 DIAGNOSIS — R6889 Other general symptoms and signs: Secondary | ICD-10-CM | POA: Diagnosis not present

## 2014-03-17 DIAGNOSIS — K219 Gastro-esophageal reflux disease without esophagitis: Secondary | ICD-10-CM | POA: Diagnosis not present

## 2014-03-17 DIAGNOSIS — J040 Acute laryngitis: Secondary | ICD-10-CM | POA: Diagnosis not present

## 2014-03-17 DIAGNOSIS — Z6831 Body mass index (BMI) 31.0-31.9, adult: Secondary | ICD-10-CM | POA: Diagnosis not present

## 2014-05-26 DIAGNOSIS — L723 Sebaceous cyst: Secondary | ICD-10-CM | POA: Diagnosis not present

## 2014-05-26 DIAGNOSIS — D239 Other benign neoplasm of skin, unspecified: Secondary | ICD-10-CM | POA: Diagnosis not present

## 2014-10-13 DIAGNOSIS — Z1389 Encounter for screening for other disorder: Secondary | ICD-10-CM | POA: Diagnosis not present

## 2014-10-13 DIAGNOSIS — Z6830 Body mass index (BMI) 30.0-30.9, adult: Secondary | ICD-10-CM | POA: Diagnosis not present

## 2014-10-13 DIAGNOSIS — M7550 Bursitis of unspecified shoulder: Secondary | ICD-10-CM | POA: Diagnosis not present

## 2014-10-13 DIAGNOSIS — E6609 Other obesity due to excess calories: Secondary | ICD-10-CM | POA: Diagnosis not present

## 2014-11-29 DIAGNOSIS — Z23 Encounter for immunization: Secondary | ICD-10-CM | POA: Diagnosis not present

## 2014-12-23 DIAGNOSIS — M19011 Primary osteoarthritis, right shoulder: Secondary | ICD-10-CM | POA: Diagnosis not present

## 2014-12-23 DIAGNOSIS — Z6831 Body mass index (BMI) 31.0-31.9, adult: Secondary | ICD-10-CM | POA: Diagnosis not present

## 2014-12-23 DIAGNOSIS — Z1389 Encounter for screening for other disorder: Secondary | ICD-10-CM | POA: Diagnosis not present

## 2014-12-23 DIAGNOSIS — E6609 Other obesity due to excess calories: Secondary | ICD-10-CM | POA: Diagnosis not present

## 2014-12-23 DIAGNOSIS — M25511 Pain in right shoulder: Secondary | ICD-10-CM | POA: Diagnosis not present

## 2014-12-27 DIAGNOSIS — H40013 Open angle with borderline findings, low risk, bilateral: Secondary | ICD-10-CM | POA: Diagnosis not present

## 2014-12-27 DIAGNOSIS — H2513 Age-related nuclear cataract, bilateral: Secondary | ICD-10-CM | POA: Diagnosis not present

## 2014-12-27 DIAGNOSIS — H35372 Puckering of macula, left eye: Secondary | ICD-10-CM | POA: Diagnosis not present

## 2014-12-31 DIAGNOSIS — M24111 Other articular cartilage disorders, right shoulder: Secondary | ICD-10-CM | POA: Diagnosis not present

## 2014-12-31 DIAGNOSIS — M25411 Effusion, right shoulder: Secondary | ICD-10-CM | POA: Diagnosis not present

## 2014-12-31 DIAGNOSIS — M19011 Primary osteoarthritis, right shoulder: Secondary | ICD-10-CM | POA: Diagnosis not present

## 2014-12-31 DIAGNOSIS — M7581 Other shoulder lesions, right shoulder: Secondary | ICD-10-CM | POA: Diagnosis not present

## 2014-12-31 DIAGNOSIS — M65811 Other synovitis and tenosynovitis, right shoulder: Secondary | ICD-10-CM | POA: Diagnosis not present

## 2014-12-31 DIAGNOSIS — M7551 Bursitis of right shoulder: Secondary | ICD-10-CM | POA: Diagnosis not present

## 2015-01-19 DIAGNOSIS — I10 Essential (primary) hypertension: Secondary | ICD-10-CM | POA: Diagnosis not present

## 2015-01-19 DIAGNOSIS — Z0000 Encounter for general adult medical examination without abnormal findings: Secondary | ICD-10-CM | POA: Diagnosis not present

## 2015-01-19 DIAGNOSIS — Z125 Encounter for screening for malignant neoplasm of prostate: Secondary | ICD-10-CM | POA: Diagnosis not present

## 2015-07-20 ENCOUNTER — Other Ambulatory Visit: Payer: Self-pay

## 2015-07-20 ENCOUNTER — Ambulatory Visit (INDEPENDENT_AMBULATORY_CARE_PROVIDER_SITE_OTHER): Payer: Medicare Other | Admitting: Gastroenterology

## 2015-07-20 ENCOUNTER — Encounter: Payer: Self-pay | Admitting: Gastroenterology

## 2015-07-20 VITALS — BP 158/88 | HR 72 | Temp 98.1°F | Ht 71.0 in | Wt 231.0 lb

## 2015-07-20 DIAGNOSIS — Z86010 Personal history of colonic polyps: Secondary | ICD-10-CM

## 2015-07-20 DIAGNOSIS — K219 Gastro-esophageal reflux disease without esophagitis: Secondary | ICD-10-CM | POA: Diagnosis not present

## 2015-07-20 DIAGNOSIS — D369 Benign neoplasm, unspecified site: Secondary | ICD-10-CM

## 2015-07-20 MED ORDER — PANTOPRAZOLE SODIUM 40 MG PO TBEC: 40.0000 mg | DELAYED_RELEASE_TABLET | Freq: Two times a day (BID) | ORAL | Status: DC

## 2015-07-20 MED ORDER — PEG 3350-KCL-NA BICARB-NACL 420 G PO SOLR: 4000.0000 mL | ORAL | Status: DC

## 2015-07-20 NOTE — Patient Instructions (Signed)
1. Stop omeprazole. Start pantoprazole 40 mg 30 minutes before breakfast and 30 minutes before your evening meal each day. Prescription sent to [facility]. 2. Upper endoscopy and colonoscopy as scheduled.

## 2015-07-20 NOTE — Assessment & Plan Note (Signed)
70 year old gentleman with chronic GERD with history of intermittent hoarseness/sore throat with symptoms for the past 3-4 weeks. Has been on omeprazole chronically. Recently started 20 mg twice daily without any added benefit. Denies typical heartburn, dysphagia. Concern for LPR previously. Switch to pantoprazole 40 mg twice daily. Patient requesting upper endoscopy which is reasonable given refractory symptoms. Plan on EGD in the near future.  I have discussed the risks, alternatives, benefits with regards to but not limited to the risk of reaction to medication, bleeding, infection, perforation and the patient is agreeable to proceed. Written consent to be obtained.

## 2015-07-20 NOTE — Progress Notes (Signed)
Primary Care Physician:  Alonza Bogus, MD  Primary Gastroenterologist:  Barney Drain, MD   Chief Complaint  Patient presents with  . Dysphagia  . Follow-up  . Gastroesophageal Reflux    HPI:  Justin Coffey is a 70 y.o. male here to discuss GERD. Patient has a history of chronic GERD, has been on omeprazole for this. No other PPIs. Continues to have flare of symptoms, last episode started 3-4 weeks ago. Recently started omeprazole 20 mg twice a day, had been on 40 mg daily and at some point switch to 20 mg daily. Complains of sore throat. Hoarseness. Loses his voice quickly. Affects his singing. Wakes up the morning with a lot of phlegm. Currently taking omeprazole before breakfast and at bedtime. Denies abdominal pain. Bowel movements are regular. No blood in the stool or melena.   H/O SITUS INVERSUS.  Current Outpatient Prescriptions  Medication Sig Dispense Refill  . aspirin EC 81 MG tablet Take 81 mg by mouth daily.    Marland Kitchen atorvastatin (LIPITOR) 20 MG tablet Take 20 mg by mouth daily.    Marland Kitchen omeprazole (PRILOSEC) 40 MG capsule Take 40 mg by mouth daily.     No current facility-administered medications for this visit.    Allergies as of 07/20/2015  . (No Known Allergies)    Past Medical History  Diagnosis Date  . Esophageal ring 03/16/08    s/p esophageal dilation Savary 65mm Dr [name]   . Colon polyps   . Tubular adenoma 12/30/2007    removed from sigmoid colon  . Situs inversus   . HTN (hypertension)   . Hyperlipemia     Past Surgical History  Procedure Laterality Date  . Tonsillectomy    . External ear surgery    . Trigger finger release Left 01/14/2013    Procedure: RELEASE A-1 PULLEY LEFT THUMB,MIDDLE AND RING FINGER;  Surgeon: Wynonia Sours, MD;  Location: [place];  Service: Orthopedics;  Laterality: Left;    Family History  Problem Relation Age of Onset  . Lung cancer Mother   . Lung cancer Father     Social History   Social History   . Marital Status: Married    Spouse Name: N/A  . Number of Children: 2  . Years of Education: N/A   Occupational History  . retired [employer]    Social History Main Topics  . Smoking status: Never Smoker   . Smokeless tobacco: Not on file  . Alcohol Use: No  . Drug Use: No  . Sexual Activity: Not on file   Other Topics Concern  . Not on file   Social History Narrative      ROS:  General: Negative for anorexia, weight loss, fever, chills, fatigue, weakness. Eyes: Negative for vision changes.  ENT: SEE HPI CV: Negative for chest pain, angina, palpitations, dyspnea on exertion, peripheral edema.  Respiratory: Negative for dyspnea at rest, dyspnea on exertion, cough, sputum, wheezing.  GI: See history of present illness. GU:  Negative for dysuria, hematuria, urinary incontinence, urinary frequency, nocturnal urination.  MS: Negative for joint pain, low back pain.  Derm: Negative for rash or itching.  Neuro: Negative for weakness, abnormal sensation, seizure, frequent headaches, memory loss, confusion.  Psych: Negative for anxiety, depression, suicidal ideation, hallucinations.  Endo: Negative for unusual weight change.  Heme: Negative for bruising or bleeding. Allergy: Negative for rash or hives.    Physical Examination:  BP 158/88 mmHg  Pulse 72  Temp(Src) 98.1 F (  36.7 C)  Ht 5\' 11"  (1.803 m)  Wt 231 lb (104.781 kg)  BMI 32.23 kg/m2   General: Well-nourished, well-developed in no acute distress.  Head: Normocephalic, atraumatic.   Eyes: Conjunctiva pink, no icterus. Mouth: Oropharyngeal mucosa moist and pink , no lesions erythema or exudate. Neck: Supple without thyromegaly, masses, or lymphadenopathy.  Lungs: Clear to auscultation bilaterally.  Heart: Regular rate and rhythm, no murmurs rubs or gallops.  Abdomen: Bowel sounds are normal, nontender, nondistended, no hepatosplenomegaly or masses, no abdominal bruits or    hernia , no rebound or guarding.    Rectal: not performed Extremities: No lower extremity edema. No clubbing or deformities.  Neuro: Alert and oriented x 4 , grossly normal neurologically.  Skin: Warm and dry, no rash or jaundice.   Psych: Alert and cooperative, normal mood and affect.

## 2015-07-20 NOTE — Assessment & Plan Note (Signed)
History of tubular adenoma removed from sigmoid colon in November 2009. Surveillance colonoscopy at this time.  I have discussed the risks, alternatives, benefits with regards to but not limited to the risk of reaction to medication, bleeding, infection, perforation and the patient is agreeable to proceed. Written consent to be obtained.

## 2015-07-20 NOTE — Progress Notes (Signed)
cc'ed to pcp °

## 2015-07-22 ENCOUNTER — Other Ambulatory Visit: Payer: Self-pay

## 2015-08-08 ENCOUNTER — Ambulatory Visit (HOSPITAL_COMMUNITY)
Admission: RE | Admit: 2015-08-08 | Discharge: 2015-08-08 | Disposition: A | Discharge status: Home / Self Care | Payer: Medicare Other | Source: Ambulatory Visit | Attending: Gastroenterology | Admitting: Gastroenterology

## 2015-08-08 ENCOUNTER — Encounter (HOSPITAL_COMMUNITY): Payer: Self-pay | Admitting: *Deleted

## 2015-08-08 ENCOUNTER — Encounter (HOSPITAL_COMMUNITY)
Admission: RE | Disposition: A | Discharge status: Home / Self Care | Payer: Self-pay | Source: Ambulatory Visit | Attending: Gastroenterology

## 2015-08-08 DIAGNOSIS — K2980 Duodenitis without bleeding: Secondary | ICD-10-CM | POA: Diagnosis not present

## 2015-08-08 DIAGNOSIS — K648 Other hemorrhoids: Secondary | ICD-10-CM | POA: Insufficient documentation

## 2015-08-08 DIAGNOSIS — E785 Hyperlipidemia, unspecified: Secondary | ICD-10-CM | POA: Insufficient documentation

## 2015-08-08 DIAGNOSIS — Q893 Situs inversus: Secondary | ICD-10-CM | POA: Insufficient documentation

## 2015-08-08 DIAGNOSIS — D127 Benign neoplasm of rectosigmoid junction: Secondary | ICD-10-CM

## 2015-08-08 DIAGNOSIS — K2970 Gastritis, unspecified, without bleeding: Secondary | ICD-10-CM

## 2015-08-08 DIAGNOSIS — Z79899 Other long term (current) drug therapy: Secondary | ICD-10-CM | POA: Insufficient documentation

## 2015-08-08 DIAGNOSIS — R1013 Epigastric pain: Secondary | ICD-10-CM | POA: Diagnosis not present

## 2015-08-08 DIAGNOSIS — K2950 Unspecified chronic gastritis without bleeding: Secondary | ICD-10-CM | POA: Insufficient documentation

## 2015-08-08 DIAGNOSIS — I10 Essential (primary) hypertension: Secondary | ICD-10-CM | POA: Diagnosis not present

## 2015-08-08 DIAGNOSIS — Z860100 Personal history of colon polyps, unspecified: Secondary | ICD-10-CM | POA: Insufficient documentation

## 2015-08-08 DIAGNOSIS — Z7982 Long term (current) use of aspirin: Secondary | ICD-10-CM | POA: Diagnosis not present

## 2015-08-08 DIAGNOSIS — Z1211 Encounter for screening for malignant neoplasm of colon: Secondary | ICD-10-CM | POA: Diagnosis not present

## 2015-08-08 DIAGNOSIS — K317 Polyp of stomach and duodenum: Secondary | ICD-10-CM | POA: Insufficient documentation

## 2015-08-08 DIAGNOSIS — K219 Gastro-esophageal reflux disease without esophagitis: Secondary | ICD-10-CM | POA: Insufficient documentation

## 2015-08-08 HISTORY — PX: ESOPHAGOGASTRODUODENOSCOPY: SHX5428

## 2015-08-08 HISTORY — PX: COLONOSCOPY: SHX5424

## 2015-08-08 SURGERY — COLONOSCOPY
Anesthesia: Moderate Sedation

## 2015-08-08 MED ORDER — MEPERIDINE HCL 100 MG/ML IJ SOLN
INTRAMUSCULAR | Status: AC
  Filled 2015-08-08: qty 2

## 2015-08-08 MED ORDER — LIDOCAINE VISCOUS 2 % MT SOLN
OROMUCOSAL | Status: AC
  Filled 2015-08-08: qty 15

## 2015-08-08 MED ORDER — MEPERIDINE HCL 100 MG/ML IJ SOLN
INTRAMUSCULAR | Status: DC | PRN
  Administered 2015-08-08 (×4): 25 mg via INTRAVENOUS

## 2015-08-08 MED ORDER — MIDAZOLAM HCL 5 MG/5ML IJ SOLN
INTRAMUSCULAR | Status: AC
  Filled 2015-08-08: qty 10

## 2015-08-08 MED ORDER — MIDAZOLAM HCL 5 MG/5ML IJ SOLN
INTRAMUSCULAR | Status: DC | PRN
  Administered 2015-08-08 (×3): 2 mg via INTRAVENOUS

## 2015-08-08 MED ORDER — SODIUM CHLORIDE 0.9 % IV SOLN
INTRAVENOUS | Status: DC
  Administered 2015-08-08: 1000 mL via INTRAVENOUS

## 2015-08-08 MED ORDER — STERILE WATER FOR IRRIGATION IR SOLN
Status: DC | PRN
  Administered 2015-08-08: 08:00:00

## 2015-08-08 MED ORDER — LIDOCAINE VISCOUS 2 % MT SOLN
OROMUCOSAL | Status: DC | PRN
  Administered 2015-08-08: 3 mL via OROMUCOSAL

## 2015-08-08 NOTE — H&P (Signed)
  Primary Care Physician:  Alonza Bogus, MD Primary Gastroenterologist:  Dr. [name]  Pre-Procedure History & Physical: HPI:  Justin Coffey is a 70 y.o. male here for PERSONAL HISTORY OF POLYPS.  Past Medical History  Diagnosis Date  . Esophageal ring 03/16/08    s/p esophageal dilation Savary 65mm Dr [name]   . Colon polyps   . Tubular adenoma 12/30/2007    removed from sigmoid colon  . Situs inversus   . HTN (hypertension)   . Hyperlipemia     Past Surgical History  Procedure Laterality Date  . Tonsillectomy    . External ear surgery    . Trigger finger release Left 01/14/2013    Procedure: RELEASE A-1 PULLEY LEFT THUMB,MIDDLE AND RING FINGER;  Surgeon: Wynonia Sours, MD;  Location: [place];  Service: Orthopedics;  Laterality: Left;    Prior to Admission medications   Medication Sig Start Date End Date Taking? Authorizing Provider  aspirin EC 81 MG tablet Take 81 mg by mouth daily.   Yes Historical Provider, MD  atorvastatin (LIPITOR) 20 MG tablet Take 20 mg by mouth daily.   Yes Historical Provider, MD  pantoprazole (PROTONIX) 40 MG tablet Take 1 tablet (40 mg total) by mouth 2 (two) times daily before a meal. 07/20/15  Yes Mahala Menghini, PA-C  polyethylene glycol-electrolytes (TRILYTE) 420 g solution Take 4,000 mLs by mouth as directed. 07/20/15  Yes Danie Binder, MD    Allergies as of 07/20/2015  . (No Known Allergies)    Family History  Problem Relation Age of Onset  . Lung cancer Mother   . Lung cancer Father     Social History   Social History  . Marital Status: Married    Spouse Name: N/A  . Number of Children: 2  . Years of Education: N/A   Occupational History  . retired [employer]    Social History Main Topics  . Smoking status: Never Smoker   . Smokeless tobacco: Not on file  . Alcohol Use: No  . Drug Use: No  . Sexual Activity: Not on file   Other Topics Concern  . Not on file   Social History Narrative    Review of  Systems: See HPI, otherwise negative ROS   Physical Exam: There were no vitals taken for this visit. General:   Alert,  pleasant and cooperative in NAD Head:  Normocephalic and atraumatic. Neck:  Supple; Lungs:  Clear throughout to auscultation.    Heart:  Regular rate and rhythm. Abdomen:  Soft, nontender and nondistended. Normal bowel sounds, without guarding, and without rebound.   Neurologic:  Alert and  oriented x4;  grossly normal neurologically.  Impression/Plan:   PERSONAL HISTORY OF POLYPS/dyspepsia  Plan:  1. TCS/EGD TODAY

## 2015-08-08 NOTE — Discharge Instructions (Signed)
You have moderate internal hemorrhoids, and HAD 1 small polyp removed. You have mild gastritis.I biopsied your stomach.   DRINK WATER TO KEEP YOUR URINE LIGHT YELLOW.  FOLLOW A HIGH FIBER/LOW FAT DIET. AVOID ITEMS THAT CAUSE BLOATING. SEE INFO BELOW.  AVOID ITEMS THAT TRIGGER GASTRITIS. See info below.  CONTINUE PROTONIX. TAKE 30 MINUTES PRIOR TO MEALS TWICE DAILY.  YOUR BIOPSY RESULTS WILL BE AVAILABLE IN MY CHART JUN 28 AND MY OFFICE WILL CONTACT YOU IN 10-14 DAYS WITH YOUR RESULTS.   FOLLOW UP IN 4 MOS.   Next colonoscopy in 5-10 years.   ENDOSCOPY Care After Read the instructions outlined below and refer to this sheet in the next week. These discharge instructions provide you with general information on caring for yourself after you leave the hospital. While your treatment has been planned according to the most current medical practices available, unavoidable complications occasionally occur. If you have any problems or questions after discharge, call DR. Yvetta Drotar, [phone].  ACTIVITY  You may resume your regular activity, but move at a slower pace for the next 24 hours.   Take frequent rest periods for the next 24 hours.   Walking will help get rid of the air and reduce the bloated feeling in your belly (abdomen).   No driving for 24 hours (because of the medicine (anesthesia) used during the test).   You may shower.   Do not sign any important legal documents or operate any machinery for 24 hours (because of the anesthesia used during the test).    NUTRITION  Drink plenty of fluids.   You may resume your normal diet as instructed by your doctor.   Begin with a light meal and progress to your normal diet. Heavy or fried foods are harder to digest and may make you feel sick to your stomach (nauseated).   Avoid alcoholic beverages for 24 hours or as instructed.    MEDICATIONS  You may resume your normal medications.   WHAT YOU CAN EXPECT TODAY  Some  feelings of bloating in the abdomen.   Passage of more gas than usual.   Spotting of blood in your stool or on the toilet paper  .  IF YOU HAD POLYPS REMOVED DURING THE ENDOSCOPY:  Eat a soft diet IF YOU HAVE NAUSEA, BLOATING, ABDOMINAL PAIN, OR VOMITING.    FINDING OUT THE RESULTS OF YOUR TEST Not all test results are available during your visit. DR. [name] WILL CALL YOU WITHIN 14 DAYS OF YOUR PROCEDUE WITH YOUR RESULTS. Do not assume everything is normal if you have not heard from DR. Batool Majid, CALL HER OFFICE AT [phone].  SEEK IMMEDIATE MEDICAL ATTENTION AND CALL THE OFFICE: [phone] IF:  You have more than a spotting of blood in your stool.   Your belly is swollen (abdominal distention).   You are nauseated or vomiting.   You have a temperature over 101F.   You have abdominal pain or discomfort that is severe or gets worse throughout the day.  Polyps, Colon  A polyp is extra tissue that grows inside your body. Colon polyps grow in the large intestine. The large intestine, also called the colon, is part of your digestive system. It is a long, hollow tube at the end of your digestive tract where your body makes and stores stool. Most polyps are not dangerous. They are benign. This means they are not cancerous. But over time, some types of polyps can turn into cancer. Polyps that are smaller than a  pea are usually not harmful. But larger polyps could someday become or may already be cancerous. To be safe, doctors remove all polyps and test them.   PREVENTION There is not one sure way to prevent polyps. You might be able to lower your risk of getting them if you:  Eat more fruits and vegetables and less fatty food.   Do not smoke.   Avoid alcohol.   Exercise every day.   Lose weight if you are overweight.   Eating more calcium and folate can also lower your risk of getting polyps. Some foods that are rich in calcium are milk, cheese, and broccoli. Some foods that  are rich in folate are chickpeas, kidney beans, and spinach.    Gastritis  Gastritis is an inflammation (the body's way of reacting to injury and/or infection) of the stomach. It is often caused by viral or bacterial (germ) infections. It can also be caused BY ASPIRIN, BC/GOODY POWDER'S, (IBUPROFEN) MOTRIN, OR ALEVE (NAPROXEN), chemicals (including alcohol), SPICY FOODS, and medications. This illness may be associated with generalized malaise (feeling tired, not well), UPPER ABDOMINAL STOMACH cramps, and fever. One common bacterial cause of gastritis is an organism known as H. Pylori. This can be treated with antibiotics.    High-Fiber Diet A high-fiber diet changes your normal diet to include more whole grains, legumes, fruits, and vegetables. Changes in the diet involve replacing refined carbohydrates with unrefined foods. The calorie level of the diet is essentially unchanged. The Dietary Reference Intake (recommended amount) for adult males is 38 grams per day. For adult females, it is 25 grams per day. Pregnant and lactating women should consume 28 grams of fiber per day. Fiber is the intact part of a plant that is not broken down during digestion. Functional fiber is fiber that has been isolated from the plant to provide a beneficial effect in the body. PURPOSE  Increase stool bulk.   Ease and regulate bowel movements.   Lower cholesterol.  REDUCE RISK OF COLON CANCER  INDICATIONS THAT YOU NEED MORE FIBER  Constipation and hemorrhoids.   Uncomplicated diverticulosis (intestine condition) and irritable bowel syndrome.   Weight management.   As a protective measure against hardening of the arteries (atherosclerosis), diabetes, and cancer.   GUIDELINES FOR INCREASING FIBER IN THE DIET  Start adding fiber to the diet slowly. A gradual increase of about 5 more grams (2 slices of whole-wheat bread, 2 servings of most fruits or vegetables, or 1 bowl of high-fiber cereal) per day is  best. Too rapid an increase in fiber may result in constipation, flatulence, and bloating.   Drink enough water and fluids to keep your urine clear or pale yellow. Water, juice, or caffeine-free drinks are recommended. Not drinking enough fluid may cause constipation.   Eat a variety of high-fiber foods rather than one type of fiber.   Try to increase your intake of fiber through using high-fiber foods rather than fiber pills or supplements that contain small amounts of fiber.   The goal is to change the types of food eaten. Do not supplement your present diet with high-fiber foods, but replace foods in your present diet.   INCLUDE A VARIETY OF FIBER SOURCES  Replace refined and processed grains with whole grains, canned fruits with fresh fruits, and incorporate other fiber sources. White rice, white breads, and most bakery goods contain little or no fiber.   Brown whole-grain rice, buckwheat oats, and many fruits and vegetables are all good sources of fiber.  These include: broccoli, Brussels sprouts, cabbage, cauliflower, beets, sweet potatoes, white potatoes (skin on), carrots, tomatoes, eggplant, squash, berries, fresh fruits, and dried fruits.   Cereals appear to be the richest source of fiber. Cereal fiber is found in whole grains and bran. Bran is the fiber-rich outer coat of cereal grain, which is largely removed in refining. In whole-grain cereals, the bran remains. In breakfast cereals, the largest amount of fiber is found in those with "bran" in their names. The fiber content is sometimes indicated on the label.   You may need to include additional fruits and vegetables each day.   In baking, for 1 cup white flour, you may use the following substitutions:   1 cup whole-wheat flour minus 2 tablespoons.   1/2 cup white flour plus 1/2 cup whole-wheat flour.   Low-Fat Diet BREADS, CEREALS, PASTA, RICE, DRIED PEAS, AND BEANS These products are high in carbohydrates and most are low  in fat. Therefore, they can be increased in the diet as substitutes for fatty foods. They too, however, contain calories and should not be eaten in excess. Cereals can be eaten for snacks as well as for breakfast.  Include foods that contain fiber (fruits, vegetables, whole grains, and legumes). Research shows that fiber may lower blood cholesterol levels, especially the water-soluble fiber found in fruits, vegetables, oat products, and legumes. FRUITS AND VEGETABLES It is good to eat fruits and vegetables. Besides being sources of fiber, both are rich in vitamins and some minerals. They help you get the daily allowances of these nutrients. Fruits and vegetables can be used for snacks and desserts. MEATS Limit lean meat, chicken, [place], and fish to no more than 6 ounces per day. Beef, Pork, and Lamb Use lean cuts of beef, pork, and lamb. Lean cuts include:  Extra-lean ground beef.  Arm roast.  Sirloin tip.  Center-cut ham.  Round steak.  Loin chops.  Rump roast.  Tenderloin.  Trim all fat off the outside of meats before cooking. It is not necessary to severely decrease the intake of red meat, but lean choices should be made. Lean meat is rich in protein and contains a highly absorbable form of iron. Premenopausal women, in particular, should avoid reducing lean red meat because this could increase the risk for low red blood cells (iron-deficiency anemia). The organ meats, such as liver, sweetbreads, kidneys, and brain are very rich in cholesterol. They should be limited. Chicken and [place] These are good sources of protein. The fat of poultry can be reduced by removing the skin and underlying fat layers before cooking. Chicken and [place] can be substituted for lean red meat in the diet. Poultry should not be fried or covered with high-fat sauces. Fish and Shellfish Fish is a good source of protein. Shellfish contain cholesterol, but they usually are low in saturated fatty acids. The preparation  of fish is important. Like chicken and [place], they should not be fried or covered with high-fat sauces. EGGS Egg whites contain no fat or cholesterol. They can be eaten often. Try 1 to 2 egg whites instead of whole eggs in recipes or use egg substitutes that do not contain yolk. MILK AND DAIRY PRODUCTS Use skim or 1% milk instead of 2% or whole milk. Decrease whole milk, natural, and processed cheeses. Use nonfat or low-fat (2%) cottage cheese or low-fat cheeses made from vegetable oils. Choose nonfat or low-fat (1 to 2%) yogurt. Experiment with evaporated skim milk in recipes that call for heavy cream. Substitute  low-fat yogurt or low-fat cottage cheese for sour cream in dips and salad dressings. Have at least 2 servings of low-fat dairy products, such as 2 glasses of skim (or 1%) milk each day to help get your daily calcium intake.  FATS AND OILS Reduce the total intake of fats, especially saturated fat. Butterfat, lard, and beef fats are high in saturated fat and cholesterol. These should be avoided as much as possible. Vegetable fats do not contain cholesterol, but certain vegetable fats, such as coconut oil, palm oil, and palm kernel oil are very high in saturated fats. These should be limited. These fats are often used in bakery goods, processed foods, popcorn, oils, and nondairy creamers. Vegetable shortenings and some peanut butters contain hydrogenated oils, which are also saturated fats. Read the labels on these foods and check for saturated vegetable oils. Unsaturated vegetable oils and fats do not raise blood cholesterol. However, they should be limited because they are fats and are high in calories. Total fat should still be limited to 30% of your daily caloric intake. Desirable liquid vegetable oils are corn oil, cottonseed oil, olive oil, canola oil, safflower oil, soybean oil, and sunflower oil. Peanut oil is not as good, but small amounts are acceptable. Buy a heart-healthy tub margarine  that has no partially hydrogenated oils in the ingredients. Mayonnaise and salad dressings often are made from unsaturated fats, but they should also be limited because of their high calorie and fat content. Seeds, nuts, peanut butter, olives, and avocados are high in fat, but the fat is mainly the unsaturated type. These foods should be limited mainly to avoid excess calories and fat. OTHER EATING TIPS Snacks  Most sweets should be limited as snacks. They tend to be rich in calories and fats, and their caloric content outweighs their nutritional value. Some good choices in snacks are graham crackers, melba toast, soda crackers, bagels (no egg), English muffins, fruits, and vegetables. These snacks are preferable to snack crackers, [place] fries, and chips. Popcorn should be air-popped or cooked in small amounts of liquid vegetable oil. Desserts Eat fruit, low-fat yogurt, and fruit ices. AVOID pastries, cake, and cookies. Sherbet, angel food cake, gelatin dessert, frozen low-fat yogurt, or other frozen products that do not contain saturated fat (pure fruit juice bars, frozen ice pops) are also acceptable.  COOKING METHODS Choose those methods that use little or no fat. They include: Poaching.  Braising.  Steaming.  Grilling.  Baking.  Stir-frying.  Broiling.  Microwaving.  Foods can be cooked in a nonstick pan without added fat, or use a nonfat cooking spray in regular cookware. Limit fried foods and avoid frying in saturated fat. Add moisture to lean meats by using water, broth, cooking wines, and other nonfat or low-fat sauces along with the cooking methods mentioned above. Soups and stews should be chilled after cooking. The fat that forms on top after a few hours in the refrigerator should be skimmed off. When preparing meals, avoid using excess salt. Salt can contribute to raising blood pressure in some people. EATING AWAY FROM HOME Order entres, potatoes, and vegetables without sauces or  butter. When meat exceeds the size of a deck of cards (3 to 4 ounces), the rest can be taken home for another meal. Choose vegetable or fruit salads and ask for low-calorie salad dressings to be served on the side. Use dressings sparingly. Limit high-fat toppings, such as bacon, crumbled eggs, cheese, sunflower seeds, and olives. Ask for heart-healthy tub margarine instead of  butter.   Hemorrhoids Hemorrhoids are dilated (enlarged) veins around the rectum. Sometimes clots will form in the veins. This makes them swollen and painful. These are called thrombosed hemorrhoids. Causes of hemorrhoids include:  Constipation.   Straining to have a bowel movement.   HEAVY LIFTING  HOME CARE INSTRUCTIONS  Eat a well balanced diet and drink 6 to 8 glasses of water every day to avoid constipation. You may also use a bulk laxative.   Avoid straining to have bowel movements.   Keep anal area dry and clean.   Do not use a donut shaped pillow or sit on the toilet for long periods. This increases blood pooling and pain.   Move your bowels when your body has the urge; this will require less straining and will decrease pain and pressure.

## 2015-08-08 NOTE — Op Note (Signed)
[facility] Patient Name: Justin Coffey Procedure Date: 08/08/2015 9:01 AM MRN: [id number] Date of Birth: 1945/11/27 Attending MD: Barney Drain , MD CSN: [id number] Age: 70 Admit Type: Outpatient Procedure:                Upper GI endoscopy Indications:              Dyspepsia-LAST EGD/DIL 2009. PT HAS SITUS INVERSUS Providers:                Barney Drain, MD, Gwenlyn Fudge RN, RN, Isabella Stalling, Technician Referring MD:             Jasper Loser. Luan Pulling, MD Medicines:                TCS + Meperidine 25 mg IV Complications:            No immediate complications. Estimated Blood Loss:     Estimated blood loss was minimal. Procedure:                Pre-Anesthesia Assessment:                           - Prior to the procedure, a History and Physical                            was performed, and patient medications and                            allergies were reviewed. The patient's tolerance of                            previous anesthesia was also reviewed. The risks                            and benefits of the procedure and the sedation                            options and risks were discussed with the patient.                            All questions were answered, and informed consent                            was obtained. Prior Anticoagulants: The patient has                            taken aspirin, last dose was day of procedure. ASA                            Grade Assessment: II - A patient with mild systemic                            disease. After reviewing the risks and benefits,  the patient was deemed in satisfactory condition to                            undergo the procedure.                           After obtaining informed consent, the endoscope was                            passed under direct vision. Throughout the                            procedure, the patient's blood pressure, pulse, and                             oxygen saturations were monitored continuously. The                            [id number] scope was introduced through the                            mouth, and advanced to the second part of duodenum.                            The upper GI endoscopy was accomplished without                            difficulty. The patient tolerated the procedure                            well. Scope In: 9:04:26 AM Scope Out: 9:13:41 AM Total Procedure Duration: 0 hours 9 minutes 15 seconds  Findings:      The examined esophagus was normal.      Scattered mild inflammation characterized by congestion (edema),       erosions and erythema was found in the gastric antrum. Biopsies were       taken with a cold forceps for Helicobacter pylori testing.      A few small sessile polyps were found in the gastric body. This was       biopsied with a cold forceps for histology.      Scattered mild inflammation was found in the duodenal bulb. Impression:               - Normal esophagus.                           - DYSPEPSIA DUE Gastritis/DUODENITIS                           - A few gastric polyps.                           - NO BARRETT'S ESOPHAGUS Moderate Sedation:      Moderate (conscious) sedation was administered by the endoscopy nurse       and supervised by the endoscopist. The following parameters were  monitored: oxygen saturation, heart rate, blood pressure, and response       to care. Total physician intraservice time was 45 minutes. Recommendation:           - High fiber diet and low fat diet.                           - Continue present medications.                           - Await pathology results.                           - Return to my office in 4 months.                           DRINK WATER TO KEEP YOUR URINE LIGHT YELLOW.                           AVOID ITEMS THAT TRIGGER GASTRITIS.                           CONTINUE PROTONIX. TAKE 30 MINUTES PRIOR TO MEALS                             TWICE DAILY.                           Next colonoscopy in 5-10 years.                           - Patient has a contact number available for                            emergencies. The signs and symptoms of potential                            delayed complications were discussed with the                            patient. Return to normal activities tomorrow.                            Written discharge instructions were provided to the                            patient. Procedure Code(s):        --- Professional ---                           [phone], Esophagogastroduodenoscopy, flexible,                            transoral; with biopsy, single or multiple                           99153, Moderate sedation services; each additional  15 minutes intraservice time                           99153, Moderate sedation services; each additional                            15 minutes intraservice time                           G0500, Moderate sedation services provided by the                            same physician or other qualified health care                            professional performing a gastrointestinal                            endoscopic service that sedation supports,                            requiring the presence of an independent trained                            observer to assist in the monitoring of the                            patient's level of consciousness and physiological                            status; initial 15 minutes of intra-service time;                            patient age 70 years or older (additional time may                            be reported with [id number], as appropriate) Diagnosis Code(s):        --- Professional ---                           K29.70, Gastritis, unspecified, without bleeding                           K31.7, Polyp of stomach and duodenum                           K29.80,  Duodenitis without bleeding                           R10.13, Epigastric pain CPT copyright 2016 [employer]. All rights reserved. The codes documented in this report are preliminary and upon coder review may  be revised to meet current compliance requirements. Barney Drain, MD Barney Drain, MD 08/08/2015 9:09:07 PM This report has been signed electronically. Number of Addenda: 0

## 2015-08-08 NOTE — Op Note (Signed)
[facility] Patient Name: Justin Coffey Procedure Date: 08/08/2015 8:16 AM MRN: [id number] Date of Birth: 1945/10/23 Attending MD: Barney Drain , MD CSN: [id number] Age: 70 Admit Type: Outpatient Procedure:                Colonoscopy WITH COLD FORCEPS BIOPSY Indications:              High risk colon cancer surveillance: Personal                            history of colonic polyps Providers:                Barney Drain, MD, Otis Peak B. Sharon Seller, RN, Isabella Stalling, Technician Referring MD:             Jasper Loser. Luan Pulling, MD Medicines:                Meperidine 75 mg IV, Midazolam 6 mg IV Complications:            No immediate complications. Estimated Blood Loss:     Estimated blood loss was minimal. Procedure:                Pre-Anesthesia Assessment:                           - Prior to the procedure, a History and Physical                            was performed, and patient medications and                            allergies were reviewed. The patient's tolerance of                            previous anesthesia was also reviewed. The risks                            and benefits of the procedure and the sedation                            options and risks were discussed with the patient.                            All questions were answered, and informed consent                            was obtained. Prior Anticoagulants: The patient has                            taken aspirin, last dose was day of procedure. ASA                            Grade Assessment: II - A patient with mild systemic  disease. After reviewing the risks and benefits,                            the patient was deemed in satisfactory condition to                            undergo the procedure.                           After obtaining informed consent, the colonoscope                            was passed under direct vision. Throughout the                         procedure, the patient's blood pressure, pulse, and                            oxygen saturations were monitored continuously. The                            [id number]) was introduced through the anus                            and advanced to the the cecum, identified by                            appendiceal orifice and ileocecal valve. The                            colonoscopy was performed without difficulty. The                            patient tolerated the procedure well. The quality                            of the bowel preparation was excellent. The                            ileocecal valve, appendiceal orifice, and rectum                            were photographed. Scope In: 8:45:17 AM Scope Out: 8:57:48 AM Scope Withdrawal Time: 0 hours 11 minutes 10 seconds  Total Procedure Duration: 0 hours 12 minutes 31 seconds  Findings:      A 3 mm polyp was found in the recto-sigmoid colon. The polyp was       sessile. The polyp was removed with a cold biopsy forceps. Resection and       retrieval were complete.      Non-bleeding internal hemorrhoids were found. The hemorrhoids were       moderate. Impression:               - One 3 mm polyp at the recto-sigmoid colon, removed                           -  Non-bleeding internal hemorrhoids. Moderate Sedation:      Moderate (conscious) sedation was administered by the endoscopy nurse       and supervised by the endoscopist. The following parameters were       monitored: oxygen saturation, heart rate, blood pressure, and response       to care. Total physician intraservice time was 45 minutes. Recommendation:           - High fiberLOW FAT diet.                           - Continue present medications.                           - Await pathology results.                           - Repeat colonoscopy in 5-10 years for surveillance.                           DRINK WATER TO KEEP YOUR URINE LIGHT YELLOW.                            AVOID ITEMS THAT TRIGGER GASTRITIS.                           CONTINUE PROTONIX. TAKE 30 MINUTES PRIOR TO MEALS                            TWICE DAILY.                           - Patient has a contact number available for                            emergencies. The signs and symptoms of potential                            delayed complications were discussed with the                            patient. Return to normal activities tomorrow.                            Written discharge instructions were provided to the                            patient. Procedure Code(s):        --- Professional ---                           [phone], Colonoscopy, flexible; with biopsy, single                            or multiple                           99153, Moderate  sedation services; each additional                            15 minutes intraservice time                           99153, Moderate sedation services; each additional                            15 minutes intraservice time                           G0500, Moderate sedation services provided by the                            same physician or other qualified health care                            professional performing a gastrointestinal                            endoscopic service that sedation supports,                            requiring the presence of an independent trained                            observer to assist in the monitoring of the                            patient's level of consciousness and physiological                            status; initial 15 minutes of intra-service time;                            patient age 75 years or older (additional time may                            be reported with [phone], as appropriate) Diagnosis Code(s):        --- Professional ---                           Z86.010, Personal history of colonic polyps                           D12.7, Benign neoplasm of  rectosigmoid junction                           K64.8, Other hemorrhoids CPT copyright 2016 [employer] Association. All rights reserved. The codes documented in this report are preliminary and upon coder review may  be revised to meet current compliance requirements. Barney Drain, MD Barney Drain, MD 08/08/2015 9:03:12 PM This report has been signed electronically. Number of Addenda: 0

## 2015-08-08 NOTE — Progress Notes (Signed)
REVIEWED-NO ADDITIONAL RECOMMENDATIONS. 

## 2015-08-10 ENCOUNTER — Encounter (HOSPITAL_COMMUNITY): Payer: Self-pay | Admitting: Gastroenterology

## 2015-08-23 ENCOUNTER — Telehealth: Payer: Self-pay | Admitting: Gastroenterology

## 2015-08-23 NOTE — Telephone Encounter (Signed)
OV made and reminder in epic °

## 2015-08-23 NOTE — Telephone Encounter (Signed)
Please call pt. He had A simple adenoma removed from hIS colon. His stomach Bx shows mild gastritis & benign stomach polyps. HIS GERD WILL BE BETTER CONTROLLED WITH PROTONIX, a 10 lb weight loss, and AVOIDING THINGS THAT TRIGGER REFLUX.   DRINK WATER TO KEEP YOUR URINE LIGHT YELLOW.  FOLLOW A HIGH FIBER/LOW FAT DIET. AVOID ITEMS THAT CAUSE BLOATING.  AVOID ITEMS THAT TRIGGER GASTRITIS.  CONTINUE PROTONIX. TAKE 30 MINUTES PRIOR TO MEALS TWICE DAILY.  FOLLOW UP IN 4 MOS E30 DYSPEPSIA/GERD.  Next colonoscopy in 5-10 years

## 2015-08-23 NOTE — Telephone Encounter (Signed)
Pt is aware of results. 

## 2015-09-09 ENCOUNTER — Other Ambulatory Visit: Payer: Self-pay | Admitting: Gastroenterology

## 2015-09-12 ENCOUNTER — Other Ambulatory Visit: Payer: Self-pay

## 2015-09-13 ENCOUNTER — Telehealth: Payer: Self-pay | Admitting: Gastroenterology

## 2015-09-13 NOTE — Telephone Encounter (Signed)
Pt called to say that he needed his pantoprazole called into [facility] and all the rest of his prescription called into [facility] mail pharmacy for a 90 day supply. [phone]

## 2015-09-13 NOTE — Telephone Encounter (Signed)
I called pt to let him know his pantoprazole was sent in to [facility] yesterday. LMOM for a return call, what other meds is he talking about.

## 2015-09-14 MED ORDER — PANTOPRAZOLE SODIUM 40 MG PO TBEC: 40.0000 mg | DELAYED_RELEASE_TABLET | Freq: Two times a day (BID) | ORAL | 3 refills | Status: DC

## 2015-09-14 NOTE — Telephone Encounter (Signed)
Pt called and said he just needs one refill to [facility], and then he would like for the remainder refills to be 90 day supply to [facility]. I told him the refill was sent in on 09/12/2015. I will call [facility] when they open and cancel the remainder and have refill person send the 90 day supply to [facility].

## 2015-09-14 NOTE — Telephone Encounter (Signed)
Done

## 2015-09-14 NOTE — Addendum Note (Signed)
Addended by: Orvil Feil on: 09/14/2015 02:08 PM   Modules accepted: Orders

## 2015-09-14 NOTE — Telephone Encounter (Signed)
I called [facility] and spoke to [facility] and she cancelled the refills on the protonix. Sending to refill box to send refills to [facility].

## 2015-10-28 DIAGNOSIS — I10 Essential (primary) hypertension: Secondary | ICD-10-CM | POA: Diagnosis not present

## 2015-10-28 DIAGNOSIS — I4891 Unspecified atrial fibrillation: Secondary | ICD-10-CM | POA: Diagnosis not present

## 2015-10-28 DIAGNOSIS — R000 Tachycardia, unspecified: Secondary | ICD-10-CM | POA: Diagnosis not present

## 2015-10-28 DIAGNOSIS — Z7982 Long term (current) use of aspirin: Secondary | ICD-10-CM | POA: Diagnosis not present

## 2015-10-28 DIAGNOSIS — K219 Gastro-esophageal reflux disease without esophagitis: Secondary | ICD-10-CM | POA: Diagnosis not present

## 2015-10-30 DIAGNOSIS — R9431 Abnormal electrocardiogram [ECG] [EKG]: Secondary | ICD-10-CM | POA: Diagnosis not present

## 2015-10-30 DIAGNOSIS — R000 Tachycardia, unspecified: Secondary | ICD-10-CM | POA: Diagnosis not present

## 2015-12-05 DIAGNOSIS — Z0000 Encounter for general adult medical examination without abnormal findings: Secondary | ICD-10-CM | POA: Diagnosis not present

## 2015-12-05 DIAGNOSIS — R7309 Other abnormal glucose: Secondary | ICD-10-CM | POA: Diagnosis not present

## 2015-12-05 DIAGNOSIS — E782 Mixed hyperlipidemia: Secondary | ICD-10-CM | POA: Diagnosis not present

## 2015-12-05 DIAGNOSIS — Z1389 Encounter for screening for other disorder: Secondary | ICD-10-CM | POA: Diagnosis not present

## 2015-12-05 DIAGNOSIS — Z23 Encounter for immunization: Secondary | ICD-10-CM | POA: Diagnosis not present

## 2015-12-05 DIAGNOSIS — I10 Essential (primary) hypertension: Secondary | ICD-10-CM | POA: Diagnosis not present

## 2015-12-05 DIAGNOSIS — Z125 Encounter for screening for malignant neoplasm of prostate: Secondary | ICD-10-CM | POA: Diagnosis not present

## 2015-12-05 DIAGNOSIS — Z6832 Body mass index (BMI) 32.0-32.9, adult: Secondary | ICD-10-CM | POA: Diagnosis not present

## 2015-12-08 ENCOUNTER — Ambulatory Visit (INDEPENDENT_AMBULATORY_CARE_PROVIDER_SITE_OTHER): Payer: Medicare Other | Admitting: Nurse Practitioner

## 2015-12-08 ENCOUNTER — Encounter: Payer: Self-pay | Admitting: Nurse Practitioner

## 2015-12-08 VITALS — BP 139/76 | HR 62 | Temp 98.1°F | Ht 71.0 in | Wt 228.4 lb

## 2015-12-08 DIAGNOSIS — D369 Benign neoplasm, unspecified site: Secondary | ICD-10-CM | POA: Diagnosis not present

## 2015-12-08 DIAGNOSIS — K219 Gastro-esophageal reflux disease without esophagitis: Secondary | ICD-10-CM | POA: Diagnosis not present

## 2015-12-08 NOTE — Progress Notes (Signed)
cc'ed to pcp °

## 2015-12-08 NOTE — Progress Notes (Signed)
Referring Provider: Sinda Du, MD Primary Care Physician:  Purvis Kilts, MD Primary GI:  Dr. [name]  Chief Complaint  Patient presents with  . Follow-up    had TCS/EGD, no problems    HPI:   Justin Coffey is a 70 y.o. male who presents for post-procedure follow-up. The patient was last seen in our office 07/20/2015 for GERD and history of tubular adenoma. It was noted she was due for surveillance colonoscopy. Described chronic GERD with worsening symptoms for the previous 3-4 weeks started 20 mg of omeprazole twice a day without relief. The patient was scheduled for EGD and colonoscopy.  Colonoscopy completed on 08/08/2015 which found a single 3 mm polyp at the rectosigmoid colon, nonbleeding internal hemorrhoids. Surgical pathology found the polyp to be simple adenoma and recommended next colonoscopy in 5-10 years. Upper endoscopy completed the same day found normal esophagus, gastritis/duodenitis, a few gastric polyps, no Barrett's esophagus. Recommended her tonics and 10 pound weight loss. Also recommend trigger avoidance.  Today he states he's doing well today. GERD symptoms doing well on Protonix. Denies abdominal pain, N/V, hematochezia, melena, unintentional weight loss. Did have an episode of AFib a month ago and was started on Cardia. Has follow-up with cardiology next week.  Past Medical History:  Diagnosis Date  . Atrial fibrillation ([place])   . Colon polyps   . Esophageal ring 03/16/08   s/p esophageal dilation Savary 12mm Dr [name]   . HTN (hypertension)   . Hyperlipemia   . Situs inversus   . Tubular adenoma 12/30/2007   removed from sigmoid colon    Past Surgical History:  Procedure Laterality Date  . COLONOSCOPY N/A 08/08/2015   Procedure: COLONOSCOPY;  Surgeon: Danie Binder, MD;  [facility];  Service: Endoscopy;  Laterality: N/A;  830  . ESOPHAGOGASTRODUODENOSCOPY N/A 08/08/2015   Procedure: ESOPHAGOGASTRODUODENOSCOPY (EGD);  Surgeon:  Danie Binder, MD;  [facility];  Service: Endoscopy;  Laterality: N/A;  . EXTERNAL EAR SURGERY    . TONSILLECTOMY    . TRIGGER FINGER RELEASE Left 01/14/2013   Procedure: RELEASE A-1 PULLEY LEFT THUMB,MIDDLE AND RING FINGER;  Surgeon: Wynonia Sours, MD;  Location: [place];  Service: Orthopedics;  Laterality: Left;    Current Outpatient Prescriptions  Medication Sig Dispense Refill  . aspirin EC 81 MG tablet Take 81 mg by mouth daily.    Marland Kitchen atorvastatin (LIPITOR) 20 MG tablet Take 20 mg by mouth daily.    Marland Kitchen CARTIA XT 180 MG 24 hr capsule Take 180 mg by mouth daily.    . Cholecalciferol (VITAMIN D3) 5000 units TABS Take 1 tablet by mouth daily.    Marland Kitchen co-enzyme Q-10 30 MG capsule Take 100 mg by mouth daily.    . Cyanocobalamin (VITAMIN B 12 PO) Take 2,500 mEq by mouth daily.    Marland Kitchen losartan (COZAAR) 100 MG tablet Take 100 mg by mouth daily.    . Multiple Vitamin (MULTIVITAMIN) tablet Take 1 tablet by mouth daily.    . Omega-3 Fatty Acids (EQL OMEGA 3 FISH OIL) 1400 MG CAPS Take 1 capsule by mouth daily.    . pantoprazole (PROTONIX) 40 MG tablet Take 1 tablet (40 mg total) by mouth 2 (two) times daily before a meal. (Patient taking differently: Take 40 mg by mouth daily. ) 180 tablet 3   No current facility-administered medications for this visit.     Allergies as of 12/08/2015  . (No Known Allergies)  Family History  Problem Relation Age of Onset  . Lung cancer Mother   . Lung cancer Father   . Colon cancer Neg Hx     Social History   Social History  . Marital status: Married    Spouse name: N/A  . Number of children: 2  . Years of education: N/A   Occupational History  . retired [employer]    Social History Main Topics  . Smoking status: Never Smoker  . Smokeless tobacco: Never Used  . Alcohol use No  . Drug use: No  . Sexual activity: Not Asked   Other Topics Concern  . None   Social History Narrative  . None    Review of  Systems: Complete ROS negative except as per HPI.   Physical Exam: BP 139/76   Pulse 62   Temp 98.1 F (36.7 C) (Oral)   Ht 5\' 11"  (1.803 m)   Wt 228 lb 6.4 oz (103.6 kg)   BMI 31.86 kg/m  General:   Alert and oriented. Pleasant and cooperative. Well-nourished and well-developed.  Ears:  Normal auditory acuity. Cardiovascular:  S1, S2 present without murmurs appreciated. Extremities without clubbing or edema. Respiratory:  Clear to auscultation bilaterally. No wheezes, rales, or rhonchi. No distress.  Gastrointestinal:  +BS, rounded but soft, non-tender and non-distended. No HSM noted. No guarding or rebound. No masses appreciated.  Rectal:  Deferred  Musculoskalatal:  Symmetrical without gross deformities. Neurologic:  Alert and oriented x4;  grossly normal neurologically. Psych:  Alert and cooperative. Normal mood and affect. Heme/Lymph/Immune: No excessive bruising noted.    12/08/2015 11:06 AM   Disclaimer: This note was dictated with voice recognition software. Similar sounding words can inadvertently be transcribed and may not be corrected upon review.

## 2015-12-08 NOTE — Assessment & Plan Note (Signed)
Simple adenoma removed on his most recent colonoscopy. History of tubular adenoma. Recommend repeat colonoscopy in 5-10 years. Currently asymptomatic from a GI standpoint. Call if any worsening symptoms or GI symptoms develop. Return for follow-up as needed.

## 2015-12-08 NOTE — Patient Instructions (Signed)
1. Continue taking Protonix. 2. Notify us of any worsening symptoms or new symptoms. Exline return for follow-up as needed.

## 2015-12-08 NOTE — Assessment & Plan Note (Signed)
Symptoms are improved on Protonix. Recommend he continue Protonix and notify us of any worsening symptoms. Return for follow-up as needed.

## 2015-12-12 DIAGNOSIS — I480 Paroxysmal atrial fibrillation: Secondary | ICD-10-CM | POA: Insufficient documentation

## 2015-12-12 DIAGNOSIS — R002 Palpitations: Secondary | ICD-10-CM | POA: Diagnosis not present

## 2015-12-12 DIAGNOSIS — Q893 Situs inversus: Secondary | ICD-10-CM | POA: Diagnosis not present

## 2015-12-12 DIAGNOSIS — I10 Essential (primary) hypertension: Secondary | ICD-10-CM | POA: Diagnosis not present

## 2015-12-28 ENCOUNTER — Encounter: Payer: Self-pay | Admitting: Cardiology

## 2015-12-28 ENCOUNTER — Ambulatory Visit (INDEPENDENT_AMBULATORY_CARE_PROVIDER_SITE_OTHER): Payer: Medicare Other | Admitting: Cardiology

## 2015-12-28 VITALS — BP 130/82 | HR 58 | Ht 71.0 in | Wt 226.8 lb

## 2015-12-28 DIAGNOSIS — I480 Paroxysmal atrial fibrillation: Secondary | ICD-10-CM | POA: Diagnosis not present

## 2015-12-28 NOTE — Progress Notes (Signed)
Electrophysiology Office Note   Date:  12/28/2015   ID:  Justin Coffey, DOB Dec 13, 1945, MRN [id number]  PCP:  Purvis Kilts, MD  Cardiologist:  [name] Primary Electrophysiologist:  Constance Haw, MD    Chief Complaint  Patient presents with  . CONSULT    AFib     History of Present Illness: Justin Coffey is a 70 y.o. male who presents today for electrophysiology evaluation.   He has a history of hypertension, atrial fibrillation, and situs inversus. He had the sudden onset of palpitations while camping mid-September. He felt that his heart was jumping around. His symptoms woken from sleep. He drove himself to the emergency room but converted to sinus rhythm without need for cardioversion. He has had atrial fibrillation in the past when he was in his 70s in the setting of an electrocution. He has had no further symptoms of atrial fibrillation since that one episode when he went to the emergency room. He is feeling well without any major complaints. He has noticed that he has been dizzy since starting the diltiazem mainly when he stands up from sitting.   Today, he denies symptoms of palpitations, chest pain, shortness of breath, orthopnea, PND, lower extremity edema, claudication, dizziness, presyncope, syncope, bleeding, or neurologic sequela. The patient is tolerating medications without difficulties and is otherwise without complaint today.    Past Medical History:  Diagnosis Date  . Atrial fibrillation ([place])   . Colon polyps   . Esophageal ring 03/16/08   s/p esophageal dilation Savary 33mm Dr [name]   . HTN (hypertension)   . Hyperlipemia   . Situs inversus   . Tubular adenoma 12/30/2007   removed from sigmoid colon   Past Surgical History:  Procedure Laterality Date  . COLONOSCOPY N/A 08/08/2015   Procedure: COLONOSCOPY;  Surgeon: Danie Binder, MD;  [facility];  Service: Endoscopy;  Laterality: N/A;  830  .  ESOPHAGOGASTRODUODENOSCOPY N/A 08/08/2015   Procedure: ESOPHAGOGASTRODUODENOSCOPY (EGD);  Surgeon: Danie Binder, MD;  [facility];  Service: Endoscopy;  Laterality: N/A;  . EXTERNAL EAR SURGERY    . TONSILLECTOMY    . TRIGGER FINGER RELEASE Left 01/14/2013   Procedure: RELEASE A-1 PULLEY LEFT THUMB,MIDDLE AND RING FINGER;  Surgeon: Wynonia Sours, MD;  Location: [facility];  Service: Orthopedics;  Laterality: Left;     Current Outpatient Prescriptions  Medication Sig Dispense Refill  . apixaban (ELIQUIS) 5 MG TABS tablet Take 5 mg by mouth 2 (two) times daily.    Marland Kitchen atorvastatin (LIPITOR) 20 MG tablet Take 20 mg by mouth daily.    Marland Kitchen CARTIA XT 180 MG 24 hr capsule Take 180 mg by mouth daily.    . Cholecalciferol (VITAMIN D3) 5000 units TABS Take 1 tablet by mouth daily.    Marland Kitchen co-enzyme Q-10 30 MG capsule Take 100 mg by mouth daily.    . Cyanocobalamin (VITAMIN B 12 PO) Take 2,500 mEq by mouth daily.    . Cyanocobalamin (VITAMIN B-12) 2500 MCG SUBL Take 2,500 mcg by mouth daily.    Marland Kitchen losartan (COZAAR) 100 MG tablet Take 100 mg by mouth daily.    . Multiple Vitamin (MULTIVITAMIN) tablet Take 1 tablet by mouth daily.    . pantoprazole (PROTONIX) 40 MG tablet Take 40 mg by mouth daily.     No current facility-administered medications for this visit.     Allergies:   Patient has no known allergies.  Social History:  The patient  reports that he has never smoked. He has never used smokeless tobacco. He reports that he does not drink alcohol or use drugs.   Family History:  The patient's family history includes Lung cancer in his father and mother.    ROS:  Please see the history of present illness.   Otherwise, review of systems is positive for dizziness.   All other systems are reviewed and negative.    PHYSICAL EXAM: VS:  BP 130/82   Pulse (!) 58   Ht 5\' 11"  (1.803 m)   Wt 226 lb 12.8 oz (102.9 kg)   BMI 31.63 kg/m  , BMI Body mass index is 31.63  kg/m. GEN: Well nourished, well developed, in no acute distress  HEENT: normal  Neck: no JVD, carotid bruits, or masses Cardiac: RRR; no murmurs, rubs, or gallops,no edema  Respiratory:  clear to auscultation bilaterally, normal work of breathing GI: soft, nontender, nondistended, + BS MS: no deformity or atrophy  Skin: warm and dry Neuro:  Strength and sensation are intact Psych: euthymic mood, full affect  EKG:  EKG is ordered today. Personal review of the ekg ordered shows sinus rhythm, rate 58, right axis deviation, QRS axis 176 (limb leads moved from right chest to left chest)  Recent Labs: No results found for requested labs within last 8760 hours.    Lipid Panel  No results found for: CHOL, TRIG, HDL, CHOLHDL, VLDL, LDLCALC, LDLDIRECT   Wt Readings from Last 3 Encounters:  12/28/15 226 lb 12.8 oz (102.9 kg)  12/08/15 228 lb 6.4 oz (103.6 kg)  08/08/15 225 lb (102.1 kg)      Other studies Reviewed: Additional studies/ records that were reviewed today include: 10/18/15 TTE Review of the above records today demonstrates:  The left ventricular size is normal. Basal left ventricular septal hypertrophy  Left ventricular systolic function is mildly reduced.  Left ventricular filling pattern is impaired. No segmental wall motion abnormalities seen in the left ventricle The right ventricular systolic function is normal. There is no significant valvular stenosis or regurgitation There is no pericardial effusion. The left atrial size is normal.  ASSESSMENT AND PLAN:  1.  Paroxysmal atrial fibrillation: on Eliquis for stroke prevention. He does have situs inversus with dextrocardia. He has had one episode of atrial fibrillation and has not had any further issues. We'll therefore continue him on his diltiazem. We did discuss the option of medical management with antiarrhythmics, but he feels like at this point he would rather just continue the diltiazem.   This patients  CHA2DS2-VASc Score and unadjusted Ischemic Stroke Rate (% per year) is equal to 2.2 % stroke rate/year from a score of 2  Above score calculated as 1 point each if present [CHF, HTN, DM, Vascular=MI/PAD/Aortic Plaque, Age if 65-74, or Male] Above score calculated as 2 points each if present [Age > 75, or Stroke/TIA/TE]   2. Hypertension: well controlled  3. Hyperlipidemia: continue statin   Current medicines are reviewed at length with the patient today.   The patient does not have concerns regarding his medicines.  The following changes were made today:  none  Labs/ tests ordered today include:  Orders Placed This Encounter  Procedures  . EKG 12-Lead     Disposition:   FU with Kendle Erker 1 year  Signed, Clydell Sposito Meredith Leeds, MD  12/28/2015 11:35 AM     [facility] [address] [place] [address] [phone] (office) [phone] (  fax)

## 2015-12-28 NOTE — Patient Instructions (Signed)

## 2016-01-04 ENCOUNTER — Institutional Professional Consult (permissible substitution): Payer: Medicare Other | Admitting: Cardiology

## 2016-02-02 ENCOUNTER — Telehealth: Payer: Self-pay

## 2016-02-02 NOTE — Telephone Encounter (Signed)
Pt is aware and the article is being mailed. He said he was going to work at losing some weight and not eating late at night. He feels that might help him a lot. He will review the article and call if he has more questions or concerns.

## 2016-02-02 NOTE — Telephone Encounter (Signed)
Please tell him that a lot of the research on PPIs was poorly done. They have been used so much that people are linking other problems to PPIs because they see people that have both, and assume one causes the other (which you cannot do). He previously was off his PPI and had worsening symptoms which is why we restarted the medication.  I have a good article from a reputable gastroenterologist at the [facility] that I think explains things about as well as I have seen. We can mail him a copy if he would like.  If he still has concerns, we can discuss at his follow-up visit or schedule a sooner visit to discuss.

## 2016-02-02 NOTE — Telephone Encounter (Signed)
Pt left Vm that he has concern about the PPI's after seeing the ads on TV.  He said he heard that they increased the risk for stomach cancer and liver problems.   He would like to discuss or would like to know if there is something more safe.  Please advise if pt needs OV to discuss.

## 2016-05-23 DIAGNOSIS — Z125 Encounter for screening for malignant neoplasm of prostate: Secondary | ICD-10-CM | POA: Diagnosis not present

## 2016-05-31 DIAGNOSIS — E663 Overweight: Secondary | ICD-10-CM | POA: Diagnosis not present

## 2016-05-31 DIAGNOSIS — E782 Mixed hyperlipidemia: Secondary | ICD-10-CM | POA: Diagnosis not present

## 2016-05-31 DIAGNOSIS — I4891 Unspecified atrial fibrillation: Secondary | ICD-10-CM | POA: Diagnosis not present

## 2016-05-31 DIAGNOSIS — Z6828 Body mass index (BMI) 28.0-28.9, adult: Secondary | ICD-10-CM | POA: Diagnosis not present

## 2016-05-31 DIAGNOSIS — Z1389 Encounter for screening for other disorder: Secondary | ICD-10-CM | POA: Diagnosis not present

## 2016-05-31 DIAGNOSIS — Z23 Encounter for immunization: Secondary | ICD-10-CM | POA: Diagnosis not present

## 2016-05-31 DIAGNOSIS — I10 Essential (primary) hypertension: Secondary | ICD-10-CM | POA: Diagnosis not present

## 2016-05-31 DIAGNOSIS — R7309 Other abnormal glucose: Secondary | ICD-10-CM | POA: Diagnosis not present

## 2016-05-31 DIAGNOSIS — Q893 Situs inversus: Secondary | ICD-10-CM | POA: Diagnosis not present

## 2016-05-31 DIAGNOSIS — Z79899 Other long term (current) drug therapy: Secondary | ICD-10-CM | POA: Diagnosis not present

## 2016-05-31 DIAGNOSIS — M19011 Primary osteoarthritis, right shoulder: Secondary | ICD-10-CM | POA: Diagnosis not present

## 2016-08-16 DIAGNOSIS — E663 Overweight: Secondary | ICD-10-CM | POA: Diagnosis not present

## 2016-08-16 DIAGNOSIS — I10 Essential (primary) hypertension: Secondary | ICD-10-CM | POA: Diagnosis not present

## 2016-08-16 DIAGNOSIS — Q893 Situs inversus: Secondary | ICD-10-CM | POA: Diagnosis not present

## 2016-08-16 DIAGNOSIS — E782 Mixed hyperlipidemia: Secondary | ICD-10-CM | POA: Diagnosis not present

## 2016-08-16 DIAGNOSIS — K219 Gastro-esophageal reflux disease without esophagitis: Secondary | ICD-10-CM | POA: Diagnosis not present

## 2016-08-16 DIAGNOSIS — M62838 Other muscle spasm: Secondary | ICD-10-CM | POA: Diagnosis not present

## 2016-08-16 DIAGNOSIS — Z6829 Body mass index (BMI) 29.0-29.9, adult: Secondary | ICD-10-CM | POA: Diagnosis not present

## 2016-08-16 DIAGNOSIS — M791 Myalgia: Secondary | ICD-10-CM | POA: Diagnosis not present

## 2016-09-03 ENCOUNTER — Encounter: Payer: Self-pay | Admitting: Orthopedic Surgery

## 2016-09-03 ENCOUNTER — Ambulatory Visit (INDEPENDENT_AMBULATORY_CARE_PROVIDER_SITE_OTHER): Payer: Medicare Other | Admitting: Orthopedic Surgery

## 2016-09-03 ENCOUNTER — Ambulatory Visit (INDEPENDENT_AMBULATORY_CARE_PROVIDER_SITE_OTHER): Payer: Medicare Other

## 2016-09-03 VITALS — BP 143/74 | HR 66 | Wt 215.0 lb

## 2016-09-03 DIAGNOSIS — D171 Benign lipomatous neoplasm of skin and subcutaneous tissue of trunk: Secondary | ICD-10-CM | POA: Diagnosis not present

## 2016-09-03 DIAGNOSIS — M546 Pain in thoracic spine: Secondary | ICD-10-CM

## 2016-09-03 NOTE — Progress Notes (Signed)
NEW PATIENT OFFICE VISIT    Chief Complaint  Patient presents with  . Shoulder Pain    left shoulder pain    71 YO MALE who presents for evaluation of left periscapular pain  The patient has been doing a lot of yard work recently. He does note parascapular pain near the left medial scapular border including the musculature dating to the thoracic spine. He says it has been getting better over the last week or so.  He also has a mass over the left superior chest area with a possible lymph node in the axilla. The mass over the supraclavicular region is 6 cm wide by 5 cm long appears to be subcutaneous mobile nontender but has changed in size over the last week and a half  He also has a previous distal clavicle AC separation which is separate and distinct from the subcutaneous mass    Review of Systems  Constitutional: Negative for chills, fever and malaise/fatigue.  Respiratory: Negative for shortness of breath.   Cardiovascular: Negative for chest pain.  Neurological: Negative for tingling and sensory change.     Past Medical History:  Diagnosis Date  . Atrial fibrillation ([place])   . Colon polyps   . Esophageal ring 03/16/08   s/p esophageal dilation Savary 32mm Dr [name]   . HTN (hypertension)   . Hyperlipemia   . Situs inversus   . Tubular adenoma 12/30/2007   removed from sigmoid colon    Past Surgical History:  Procedure Laterality Date  . COLONOSCOPY N/A 08/08/2015   Procedure: COLONOSCOPY;  Surgeon: Danie Binder, MD;  [facility];  Service: Endoscopy;  Laterality: N/A;  830  . ESOPHAGOGASTRODUODENOSCOPY N/A 08/08/2015   Procedure: ESOPHAGOGASTRODUODENOSCOPY (EGD);  Surgeon: Danie Binder, MD;  [facility];  Service: Endoscopy;  Laterality: N/A;  . EXTERNAL EAR SURGERY    . TONSILLECTOMY    . TRIGGER FINGER RELEASE Left 01/14/2013   Procedure: RELEASE A-1 PULLEY LEFT THUMB,MIDDLE AND RING FINGER;  Surgeon: Wynonia Sours, MD;  Location: [place];  Service: Orthopedics;  Laterality: Left;    Family History  Problem Relation Age of Onset  . Lung cancer Mother   . Lung cancer Father   . Colon cancer Neg Hx    Social History  Substance Use Topics  . Smoking status: Never Smoker  . Smokeless tobacco: Never Used  . Alcohol use No    BP (!) 143/74   Pulse 66   Wt 215 lb (97.5 kg)   BMI 29.99 kg/m   Physical Exam  Constitutional: He is oriented to person, place, and time. He appears well-developed and well-nourished.  Vital signs have been reviewed and are stable. Gen. appearance the patient is well-developed and well-nourished with normal grooming and hygiene.   Musculoskeletal:  GAIT IS normal  Lymphadenopathy:    He has axillary adenopathy.       Left axillary: Pectoral adenopathy present.  Neurological: He is alert and oriented to person, place, and time.  Skin: Skin is warm and dry. No erythema.  Psychiatric: He has a normal mood and affect.  Vitals reviewed.   Left Shoulder Exam   Tenderness  None  Range of Motion  Normal left shoulder ROM  Muscle Strength  Normal left shoulder strength  Tests  Impingement:   Negative Apprehension: Negative  Comments:  We do find some mild tenderness at the superior border of the left scapula. Cervical and thoracic spine nontender.  Right Shoulder Exam   Tenderness  None  Range of Motion  Normal right shoulder ROM  Muscle Strength  Normal right shoulder strength  Tests  Apprehension: Negative       Encounter Diagnoses  Name Primary?  . Acute left-sided thoracic back pain Yes  . Lipoma of chest wall    Imaging studies of the thoracic spine  PLAN:   Recommend symptomatic treatment for the left parascapular region with heat plus or minus ibuprofen as needed, Max Vergie Living would also be beneficial  Recommend consult with Dr. Arnoldo Morale with a super clavicular anterior chest wall mass

## 2016-09-03 NOTE — Patient Instructions (Addendum)
Apply heat for 20 minutes as needed to the area of maximal tenderness over the left scapula, you can also take 400 mg ibuprofen as needed.  Biofreeze or Max Freeze can be used over-the-counter as needed  We will refer to Dr. Arnoldo Morale for evaluation of the mass over the left collarbone area

## 2016-09-06 ENCOUNTER — Telehealth: Payer: Self-pay | Admitting: Orthopedic Surgery

## 2016-09-06 NOTE — Telephone Encounter (Signed)
Patient called and stated that we referred him to Dr. Arnoldo Morale for lipoma.  He said that the appointment is not until 09-27-16.  He said that Dr. Aline Brochure had told him he could refer him to Dr. Arnoldo Morale or someone in [place].  He would like for [place] to see if he can get in somewhere else sooner than the 16th.  Please advise  Thanks

## 2016-09-25 ENCOUNTER — Other Ambulatory Visit: Payer: Self-pay | Admitting: General Surgery

## 2016-09-25 ENCOUNTER — Ambulatory Visit (INDEPENDENT_AMBULATORY_CARE_PROVIDER_SITE_OTHER): Payer: Medicare Other | Admitting: General Surgery

## 2016-09-25 ENCOUNTER — Encounter: Payer: Self-pay | Admitting: General Surgery

## 2016-09-25 VITALS — BP 152/80 | HR 68 | Temp 98.2°F | Resp 18 | Ht 71.0 in | Wt 212.0 lb

## 2016-09-25 DIAGNOSIS — D171 Benign lipomatous neoplasm of skin and subcutaneous tissue of trunk: Secondary | ICD-10-CM

## 2016-09-25 NOTE — Patient Instructions (Signed)
Lipoma A lipoma is a noncancerous (benign) tumor that is made up of fat cells. This is a very common type of soft-tissue growth. Lipomas are usually found under the skin (subcutaneous). They may occur in any tissue of the body that contains fat. Common areas for lipomas to appear include the back, shoulders, buttocks, and thighs. Lipomas grow slowly, and they are usually painless. Most lipomas do not cause problems and do not require treatment. What are the causes? The cause of this condition is not known. What increases the risk? This condition is more likely to develop in:  People who are 40-60 years old.  People who have a family history of lipomas.  What are the signs or symptoms? A lipoma usually appears as a small, round bump under the skin. It may feel soft or rubbery, but the firmness can vary. Most lipomas are not painful. However, a lipoma may become painful if it is located in an area where it pushes on nerves. How is this diagnosed? A lipoma can usually be diagnosed with a physical exam. You may also have tests to confirm the diagnosis and to rule out other conditions. Tests may include:  Imaging tests, such as a CT scan or MRI.  Removal of a tissue sample to be looked at under a microscope (biopsy).  How is this treated? Treatment is not needed for small lipomas that are not causing problems. If a lipoma continues to get bigger or it causes problems, removal is often the best option. Lipomas can also be removed to improve appearance. Removal of a lipoma is usually done with a surgery in which the fatty cells and the surrounding capsule are removed. Most often, a medicine that numbs the area (local anesthetic) is used for this procedure. Follow these instructions at home:  Keep all follow-up visits as directed by your health care provider. This is important. Contact a health care provider if:  Your lipoma becomes larger or hard.  Your lipoma becomes painful, red, or  increasingly swollen. These could be signs of infection or a more serious condition. This information is not intended to replace advice given to you by your health care provider. Make sure you discuss any questions you have with your health care provider. Document Released: 01/19/2002 Document Revised: 07/07/2015 Document Reviewed: 01/25/2014 Elsevier Interactive Patient Education  2018 Elsevier Inc.  
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
## 2016-09-26 ENCOUNTER — Other Ambulatory Visit: Payer: Self-pay | Admitting: Dermatology

## 2016-09-26 DIAGNOSIS — D225 Melanocytic nevi of trunk: Secondary | ICD-10-CM | POA: Diagnosis not present

## 2016-09-26 DIAGNOSIS — D229 Melanocytic nevi, unspecified: Secondary | ICD-10-CM

## 2016-09-26 DIAGNOSIS — D492 Neoplasm of unspecified behavior of bone, soft tissue, and skin: Secondary | ICD-10-CM | POA: Diagnosis not present

## 2016-09-26 DIAGNOSIS — L821 Other seborrheic keratosis: Secondary | ICD-10-CM | POA: Diagnosis not present

## 2016-09-26 HISTORY — DX: Melanocytic nevi, unspecified: D22.9

## 2016-09-27 ENCOUNTER — Ambulatory Visit: Payer: Medicare Other | Admitting: General Surgery

## 2016-09-27 ENCOUNTER — Other Ambulatory Visit: Payer: Self-pay | Admitting: General Surgery

## 2016-09-27 DIAGNOSIS — D1779 Benign lipomatous neoplasm of other sites: Secondary | ICD-10-CM

## 2016-09-28 ENCOUNTER — Ambulatory Visit (HOSPITAL_COMMUNITY)
Admission: RE | Admit: 2016-09-28 | Discharge: 2016-09-28 | Disposition: A | Discharge status: Home / Self Care | Payer: Medicare Other | Source: Ambulatory Visit | Attending: General Surgery | Admitting: General Surgery

## 2016-09-28 DIAGNOSIS — D1779 Benign lipomatous neoplasm of other sites: Secondary | ICD-10-CM

## 2016-09-28 DIAGNOSIS — R221 Localized swelling, mass and lump, neck: Secondary | ICD-10-CM | POA: Diagnosis not present

## 2016-09-28 DIAGNOSIS — D170 Benign lipomatous neoplasm of skin and subcutaneous tissue of head, face and neck: Secondary | ICD-10-CM | POA: Insufficient documentation

## 2016-09-28 MED ORDER — SODIUM CHLORIDE 0.9% FLUSH
INTRAVENOUS | Status: AC
  Filled 2016-09-28: qty 10

## 2016-10-02 ENCOUNTER — Telehealth: Payer: Self-pay | Admitting: Orthopedic Surgery

## 2016-10-02 NOTE — Telephone Encounter (Signed)
Patient came by and stated he saw Dr. Arnoldo Morale but now wants to get a new referral to Dr. Tera Helper at [facility].

## 2016-10-04 ENCOUNTER — Other Ambulatory Visit: Payer: Self-pay | Admitting: *Deleted

## 2016-10-04 ENCOUNTER — Telehealth: Payer: Self-pay | Admitting: Cardiology

## 2016-10-04 DIAGNOSIS — D171 Benign lipomatous neoplasm of skin and subcutaneous tissue of trunk: Secondary | ICD-10-CM

## 2016-10-04 NOTE — Telephone Encounter (Signed)
Reviewed medication with patient.  Updated that he is not taking B12 or Protonix anymore -- I removed them from his medication list. He thanks me for helping.

## 2016-10-04 NOTE — Telephone Encounter (Signed)
Referral sent 

## 2016-10-04 NOTE — Telephone Encounter (Signed)
Pt needs a call back to confirm the medications on his list before his appointment please.

## 2016-10-05 DIAGNOSIS — D170 Benign lipomatous neoplasm of skin and subcutaneous tissue of head, face and neck: Secondary | ICD-10-CM | POA: Diagnosis not present

## 2016-10-05 DIAGNOSIS — Z1389 Encounter for screening for other disorder: Secondary | ICD-10-CM | POA: Diagnosis not present

## 2016-10-05 DIAGNOSIS — I10 Essential (primary) hypertension: Secondary | ICD-10-CM | POA: Diagnosis not present

## 2016-10-05 DIAGNOSIS — E663 Overweight: Secondary | ICD-10-CM | POA: Diagnosis not present

## 2016-10-05 DIAGNOSIS — Z6829 Body mass index (BMI) 29.0-29.9, adult: Secondary | ICD-10-CM | POA: Diagnosis not present

## 2016-10-08 NOTE — Telephone Encounter (Signed)
Patient called, left voice message 10/08/16 - states he "has taken care of this request"; said that "we can take this note out of his file, as already taken care of."

## 2016-10-23 DIAGNOSIS — R0789 Other chest pain: Secondary | ICD-10-CM | POA: Diagnosis not present

## 2016-10-23 DIAGNOSIS — Z1389 Encounter for screening for other disorder: Secondary | ICD-10-CM | POA: Diagnosis not present

## 2016-10-23 DIAGNOSIS — T148XXD Other injury of unspecified body region, subsequent encounter: Secondary | ICD-10-CM | POA: Diagnosis not present

## 2016-10-23 DIAGNOSIS — M546 Pain in thoracic spine: Secondary | ICD-10-CM | POA: Diagnosis not present

## 2016-10-23 DIAGNOSIS — Z6828 Body mass index (BMI) 28.0-28.9, adult: Secondary | ICD-10-CM | POA: Diagnosis not present

## 2016-10-30 DIAGNOSIS — R0789 Other chest pain: Secondary | ICD-10-CM | POA: Diagnosis not present

## 2016-10-31 DIAGNOSIS — D171 Benign lipomatous neoplasm of skin and subcutaneous tissue of trunk: Secondary | ICD-10-CM | POA: Diagnosis not present

## 2016-11-15 DIAGNOSIS — Z23 Encounter for immunization: Secondary | ICD-10-CM | POA: Diagnosis not present

## 2016-12-11 ENCOUNTER — Institutional Professional Consult (permissible substitution) (INDEPENDENT_AMBULATORY_CARE_PROVIDER_SITE_OTHER): Payer: Medicare Other | Admitting: Cardiothoracic Surgery

## 2016-12-11 ENCOUNTER — Encounter: Payer: Self-pay | Admitting: Cardiothoracic Surgery

## 2016-12-11 ENCOUNTER — Other Ambulatory Visit: Payer: Self-pay | Admitting: *Deleted

## 2016-12-11 ENCOUNTER — Encounter: Payer: Self-pay | Admitting: *Deleted

## 2016-12-11 VITALS — BP 140/73 | HR 57 | Resp 16 | Ht 71.0 in | Wt 205.0 lb

## 2016-12-11 DIAGNOSIS — R222 Localized swelling, mass and lump, trunk: Secondary | ICD-10-CM

## 2016-12-11 DIAGNOSIS — Z7901 Long term (current) use of anticoagulants: Secondary | ICD-10-CM | POA: Diagnosis not present
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
## 2016-12-12 ENCOUNTER — Other Ambulatory Visit: Payer: Self-pay | Admitting: *Deleted

## 2016-12-12 DIAGNOSIS — R222 Localized swelling, mass and lump, trunk: Secondary | ICD-10-CM

## 2016-12-12 DIAGNOSIS — R2232 Localized swelling, mass and lump, left upper limb: Secondary | ICD-10-CM

## 2016-12-14 ENCOUNTER — Encounter: Payer: Self-pay | Admitting: Cardiology

## 2016-12-25 ENCOUNTER — Encounter: Payer: Self-pay | Admitting: Cardiothoracic Surgery

## 2016-12-25 ENCOUNTER — Ambulatory Visit (INDEPENDENT_AMBULATORY_CARE_PROVIDER_SITE_OTHER): Payer: Medicare Other | Admitting: Cardiothoracic Surgery

## 2016-12-25 ENCOUNTER — Other Ambulatory Visit: Payer: Self-pay

## 2016-12-25 ENCOUNTER — Ambulatory Visit
Admission: RE | Admit: 2016-12-25 | Discharge: 2016-12-25 | Disposition: A | Discharge status: Home / Self Care | Payer: Medicare Other | Source: Ambulatory Visit | Attending: Cardiothoracic Surgery | Admitting: Cardiothoracic Surgery

## 2016-12-25 VITALS — BP 132/74 | HR 58 | Ht 71.0 in | Wt 205.0 lb

## 2016-12-25 DIAGNOSIS — Z7901 Long term (current) use of anticoagulants: Secondary | ICD-10-CM | POA: Diagnosis not present

## 2016-12-25 DIAGNOSIS — R222 Localized swelling, mass and lump, trunk: Secondary | ICD-10-CM | POA: Diagnosis not present

## 2016-12-25 DIAGNOSIS — R221 Localized swelling, mass and lump, neck: Secondary | ICD-10-CM | POA: Diagnosis not present

## 2016-12-25 DIAGNOSIS — R2232 Localized swelling, mass and lump, left upper limb: Secondary | ICD-10-CM

## 2016-12-25 MED ORDER — GADOBENATE DIMEGLUMINE 529 MG/ML IV SOLN
18.0000 mL | Freq: Once | INTRAVENOUS | Status: AC | PRN
  Administered 2016-12-25: 18 mL via INTRAVENOUS
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
## 2016-12-26 ENCOUNTER — Other Ambulatory Visit: Payer: Self-pay

## 2016-12-26 DIAGNOSIS — R222 Localized swelling, mass and lump, trunk: Secondary | ICD-10-CM

## 2016-12-28 ENCOUNTER — Ambulatory Visit (INDEPENDENT_AMBULATORY_CARE_PROVIDER_SITE_OTHER): Payer: Medicare Other | Admitting: Cardiology

## 2016-12-28 ENCOUNTER — Encounter: Payer: Self-pay | Admitting: Cardiology

## 2016-12-28 VITALS — BP 122/74 | HR 55 | Ht 71.0 in | Wt 209.2 lb

## 2016-12-28 DIAGNOSIS — E785 Hyperlipidemia, unspecified: Secondary | ICD-10-CM | POA: Diagnosis not present

## 2016-12-28 DIAGNOSIS — I10 Essential (primary) hypertension: Secondary | ICD-10-CM

## 2016-12-28 DIAGNOSIS — I480 Paroxysmal atrial fibrillation: Secondary | ICD-10-CM
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
## 2016-12-28 NOTE — Patient Instructions (Addendum)
Medication Instructions:  Your physician recommends that you continue on your current medications as directed. Please refer to the Current Medication list given to you today.  * If you need a refill on your cardiac medications before your next appointment, please call your pharmacy.   Labwork: None ordered  Testing/Procedures: None ordered  Follow-Up: Your physician wants you to follow-up in: 1 year with Dr. Camnitz.  You will receive a reminder letter in the mail two months in advance. If you don't receive a letter, please call our office to schedule the follow-up appointment.  Thank you for choosing [facility]!!   Damaris Abeln, RN [phone]        

## 2017-01-01 DIAGNOSIS — E663 Overweight: Secondary | ICD-10-CM | POA: Diagnosis not present

## 2017-01-01 DIAGNOSIS — Z6828 Body mass index (BMI) 28.0-28.9, adult: Secondary | ICD-10-CM | POA: Diagnosis not present

## 2017-01-01 DIAGNOSIS — D179 Benign lipomatous neoplasm, unspecified: Secondary | ICD-10-CM | POA: Diagnosis not present

## 2017-01-01 DIAGNOSIS — Z1389 Encounter for screening for other disorder: Secondary | ICD-10-CM | POA: Diagnosis not present

## 2017-01-01 DIAGNOSIS — I889 Nonspecific lymphadenitis, unspecified: Secondary | ICD-10-CM | POA: Diagnosis not present

## 2017-01-01 DIAGNOSIS — G540 Brachial plexus disorders: Secondary | ICD-10-CM | POA: Diagnosis not present

## 2017-01-04 NOTE — Pre-Procedure Instructions (Signed)
KINDRICK LANKFORD  01/04/2017      [facility] - [place], [place] - [id number] [place] [address] Phone: [phone] Fax: [phone]    Your procedure is scheduled on Tuesday, January 08, 2017  Report to [facility] Admitting Entrance "A" at 5:30AM   Call this number if you have problems the morning of surgery:  [phone]   Remember:  Do not eat food or drink liquids after midnight.  Take these medicines the morning of surgery with A SIP OF WATER: CARTIA XT.  Follow your doctor's instruction regarding Eliquis.  As of today, stop taking all Aspirins, Vitamins, Fish oils, and Herbal medications. Also stop all NSAIDS i.e. Advil, Ibuprofen, Motrin, Aleve, Anaprox, Naproxen, BC and Goody Powders.   Do not wear jewelry.  Do not wear lotions, powders, colognes, or deodorant.  Do not shave 48 hours prior to surgery.  Men may shave face and neck.  Do not bring valuables to the hospital.  [facility] is not responsible for any belongings or valuables.  Contacts, dentures or bridgework may not be worn into surgery.  Leave your suitcase in the car.  After surgery it may be brought to your room.  For patients admitted to the hospital, discharge time will be determined by your treatment team.  Patients discharged the day of surgery will not be allowed to drive home.   Special instructions:   [facility]- Preparing For Surgery  Before surgery, you can play an important role. Because skin is not sterile, your skin needs to be as free of germs as possible. You can reduce the number of germs on your skin by washing with CHG (chlorahexidine gluconate) Soap before surgery.  CHG is an antiseptic cleaner which kills germs and bonds with the skin to continue killing germs even after washing.  Please do not use if you have an allergy to CHG or antibacterial soaps. If your skin becomes reddened/irritated stop using the CHG.  Do not shave  (including legs and underarms) for at least 48 hours prior to first CHG shower. It is OK to shave your face.  Please follow these instructions carefully.   1. Shower the NIGHT BEFORE SURGERY and the MORNING OF SURGERY with CHG.   2. If you chose to wash your hair, wash your hair first as usual with your normal shampoo.  3. After you shampoo, rinse your hair and body thoroughly to remove the shampoo.  4. Use CHG as you would any other liquid soap. You can apply CHG directly to the skin and wash gently with a scrungie or a clean washcloth.   5. Apply the CHG Soap to your body ONLY FROM THE NECK DOWN.  Do not use on open wounds or open sores. Avoid contact with your eyes, ears, mouth and genitals (private parts). Wash Face and genitals (private parts)  with your normal soap.  6. Wash thoroughly, paying special attention to the area where your surgery will be performed.  7. Thoroughly rinse your body with warm water from the neck down.  8. DO NOT shower/wash with your normal soap after using and rinsing off the CHG Soap.  9. Pat yourself dry with a CLEAN TOWEL.  10. Wear CLEAN PAJAMAS to bed the night before surgery, wear comfortable clothes the morning of surgery  11. Place CLEAN SHEETS on your bed the night of your first shower and DO NOT SLEEP WITH PETS.  Day of Surgery:  Do not apply any deodorants/lotions. Please wear clean clothes to the hospital/surgery center.    Please read over the following fact sheets that you were given. Pain Booklet, Coughing and Deep Breathing, Blood Transfusion Information, MRSA Information and Surgical Site Infection Prevention

## 2017-01-07 ENCOUNTER — Encounter (HOSPITAL_COMMUNITY): Payer: Medicare Other

## 2017-01-07 ENCOUNTER — Ambulatory Visit (HOSPITAL_COMMUNITY)
Admission: RE | Admit: 2017-01-07 | Discharge: 2017-01-07 | Disposition: A | Discharge status: Home / Self Care | Payer: Medicare Other | Source: Ambulatory Visit | Attending: Cardiothoracic Surgery | Admitting: Cardiothoracic Surgery

## 2017-01-07 ENCOUNTER — Other Ambulatory Visit (HOSPITAL_COMMUNITY): Payer: Medicare Other

## 2017-01-07 ENCOUNTER — Encounter (HOSPITAL_COMMUNITY): Payer: Self-pay

## 2017-01-07 ENCOUNTER — Other Ambulatory Visit: Payer: Self-pay

## 2017-01-07 ENCOUNTER — Encounter (HOSPITAL_COMMUNITY)
Admission: RE | Admit: 2017-01-07 | Discharge: 2017-01-07 | Disposition: A | Discharge status: Home / Self Care | Payer: Medicare Other | Source: Ambulatory Visit | Attending: Cardiothoracic Surgery | Admitting: Cardiothoracic Surgery

## 2017-01-07 DIAGNOSIS — I4891 Unspecified atrial fibrillation: Secondary | ICD-10-CM | POA: Insufficient documentation

## 2017-01-07 DIAGNOSIS — R918 Other nonspecific abnormal finding of lung field: Secondary | ICD-10-CM | POA: Diagnosis not present

## 2017-01-07 DIAGNOSIS — I10 Essential (primary) hypertension: Secondary | ICD-10-CM | POA: Diagnosis not present

## 2017-01-07 DIAGNOSIS — R222 Localized swelling, mass and lump, trunk: Secondary | ICD-10-CM | POA: Diagnosis not present

## 2017-01-07 HISTORY — DX: Cardiac arrhythmia, unspecified: I49.9

## 2017-01-07 HISTORY — DX: Gastro-esophageal reflux disease without esophagitis: K21.9

## 2017-01-07 LAB — URINALYSIS, ROUTINE W REFLEX MICROSCOPIC
Bilirubin Urine: NEGATIVE
Glucose, UA: NEGATIVE mg/dL
Hgb urine dipstick: NEGATIVE
Ketones, ur: NEGATIVE mg/dL
Leukocytes, UA: NEGATIVE
Nitrite: NEGATIVE
Protein, ur: NEGATIVE mg/dL
Specific Gravity, Urine: 1.02 (ref 1.005–1.030)
pH: 7 (ref 5.0–8.0)

## 2017-01-07 LAB — PULMONARY FUNCTION TEST
DL/VA % pred: 126 %
DL/VA: 5.89 ml/min/mmHg/L
DLCO cor % pred: 104 %
DLCO cor: 35.23 ml/min/mmHg
DLCO unc % pred: 108 %
DLCO unc: 36.55 ml/min/mmHg
FEF 25-75 Post: 4.09 L/sec
FEF 25-75 Pre: 3.53 L/sec
FEF2575-%Change-Post: 15 %
FEF2575-%Pred-Post: 156 %
FEF2575-%Pred-Pre: 134 %
FEV1-%Change-Post: 2 %
FEV1-%Pred-Post: 103 %
FEV1-%Pred-Pre: 100 %
FEV1-Post: 3.53 L
FEV1-Pre: 3.45 L
FEV1FVC-%Change-Post: 3 %
FEV1FVC-%Pred-Pre: 113 %
FEV6-%Change-Post: -1 %
FEV6-%Pred-Post: 92 %
FEV6-%Pred-Pre: 93 %
FEV6-Post: 4.08 L
FEV6-Pre: 4.12 L
FEV6FVC-%Change-Post: 0 %
FEV6FVC-%Pred-Post: 105 %
FEV6FVC-%Pred-Pre: 105 %
FVC-%Change-Post: -1 %
FVC-%Pred-Post: 87 %
FVC-%Pred-Pre: 88 %
FVC-Post: 4.08 L
FVC-Pre: 4.12 L
Post FEV1/FVC ratio: 87 %
Post FEV6/FVC ratio: 100 %
Pre FEV1/FVC ratio: 84 %
Pre FEV6/FVC Ratio: 100 %
RV % pred: 75 %
RV: 1.86 L
TLC % pred: 85 %
TLC: 6.15 L

## 2017-01-07 LAB — BLOOD GAS, ARTERIAL
Acid-Base Excess: 1.6 mmol/L (ref 0.0–2.0)
Bicarbonate: 25.5 mmol/L (ref 20.0–28.0)
Drawn by: 470591
FIO2: 21
O2 Saturation: 97.8 %
Patient temperature: 98.6
pCO2 arterial: 39.1 mmHg (ref 32.0–48.0)
pH, Arterial: 7.431 (ref 7.350–7.450)
pO2, Arterial: 102 mmHg (ref 83.0–108.0)

## 2017-01-07 LAB — CBC
HCT: 44.7 % (ref 39.0–52.0)
Hemoglobin: 15.7 g/dL (ref 13.0–17.0)
MCH: 31.5 pg (ref 26.0–34.0)
MCHC: 35.1 g/dL (ref 30.0–36.0)
MCV: 89.8 fL (ref 78.0–100.0)
Platelets: 161 10*3/uL (ref 150–400)
RBC: 4.98 MIL/uL (ref 4.22–5.81)
RDW: 12.4 % (ref 11.5–15.5)
WBC: 10.4 10*3/uL (ref 4.0–10.5)

## 2017-01-07 LAB — PROTIME-INR
INR: 1.04
Prothrombin Time: 13.5 seconds (ref 11.4–15.2)

## 2017-01-07 LAB — COMPREHENSIVE METABOLIC PANEL
ALT: 25 U/L (ref 17–63)
AST: 22 U/L (ref 15–41)
Albumin: 4.1 g/dL (ref 3.5–5.0)
Alkaline Phosphatase: 41 U/L (ref 38–126)
Anion gap: 8 (ref 5–15)
BUN: 20 mg/dL (ref 6–20)
CO2: 23 mmol/L (ref 22–32)
Calcium: 9.3 mg/dL (ref 8.9–10.3)
Chloride: 106 mmol/L (ref 101–111)
Creatinine, Ser: 1.01 mg/dL (ref 0.61–1.24)
GFR calc Af Amer: >60 mL/min (ref >60)
GFR calc non Af Amer: >60 mL/min (ref >60)
Glucose, Bld: 112 mg/dL — ABNORMAL HIGH (ref 65–99)
Potassium: 4.3 mmol/L (ref 3.5–5.1)
Sodium: 137 mmol/L (ref 135–145)
Total Bilirubin: 1.4 mg/dL — ABNORMAL HIGH (ref 0.3–1.2)
Total Protein: 6.9 g/dL (ref 6.5–8.1)

## 2017-01-07 LAB — TYPE AND SCREEN
ABO/RH(D): A POS
Antibody Screen: NEGATIVE

## 2017-01-07 LAB — SURGICAL PCR SCREEN
MRSA, PCR: NEGATIVE
STAPHYLOCOCCUS AUREUS: NEGATIVE

## 2017-01-07 LAB — APTT: aPTT: 28 seconds (ref 24–36)

## 2017-01-07 LAB — ABO/RH: ABO/RH(D): A POS

## 2017-01-07 MED ORDER — ALBUTEROL SULFATE (2.5 MG/3ML) 0.083% IN NEBU
2.5000 mg | INHALATION_SOLUTION | Freq: Once | RESPIRATORY_TRACT | Status: AC
Start: 2017-01-07 — End: 2017-01-07
  Administered 2017-01-07: 2.5 mg via RESPIRATORY_TRACT

## 2017-01-07 NOTE — Progress Notes (Signed)
Anesthesia chart review: Patient is a 71 year old male scheduled for resection of left supraclavicular mass on 01/08/2017 by Dr. [name]. It is a suspected lipoma, but was referred to CT surgery due to location.   History includes never smoker, situs inversus, hypertension, hyperlipidemia, atrial fibrillation/PAF (previously in the setting of electrocution 30+ years ago, but recurrent afib with RVR 10/2015), GERD, tonsillectomy, sigmoid tubular adenoma (s/p removal).  - PCP is Dr. Sharilyn Sites. - Cardiologist is Dr. [name]. Last visit 12/12/15 ([place]). One year EP follow-up recommended.  - EP cardiologist is Dr. Allegra Lai. One year follow-up recommended. He is aware of surgery plans.  Meds include Eliquis (last pre-op dose 01/03/17), Lipitor, Cartia XT, losartan, saw palmetto.  BP (!) 145/70   Pulse 61   Temp 36.6 C   Resp 20   Ht 5\' 11"  (1.803 m)   Wt 206 lb 12.8 oz (93.8 kg)   SpO2 100%   BMI 28.84 kg/m   EKG 12/28/16 ([place]): SB at 55 bpm, cannot rule out inferior infarct (age undetermined).  Echo 10/28/15 ([place]): Situs Inversus noted. SUMMARY The left ventricular size is normal. Basal left ventricular septal hypertrophy Left ventricular systolic function is mildly reduced. (No specific LVEF documented in report) Left ventricular filling pattern is impaired. No segmental wall motion abnormalities seen in the left ventricle The right ventricular systolic function is normal. There is no significant valvular stenosis or regurgitation There is no pericardial effusion. There is no comparison study available.  CXR 01/07/17: IMPRESSION: No active cardiopulmonary disease.  Known situs versus.  MRI Chest 12/25/16: IMPRESSION: 1. Large 9.2 cm left supraclavicular mass demonstrating signal characteristics consistent with a lipoma. No findings to suggest liposarcoma.  Chest CT 10/30/16 ([place]):  IMPRESSION: 1.No acute findings.  2. 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  3. Cholelithiasis.  Preoperative labs noted. Creatinine 1.01. Total bilirubin 1.4. AST and ALTs within normal limits. CBC, UA, and PT/PTT within normal limits.  If no acute changes then I anticipate that he can proceed as planned.  [facility] Short Stay Center/Anesthesiology Phone [phone] 01/07/2017 11:10 AM

## 2017-01-07 NOTE — Pre-Procedure Instructions (Signed)
Justin Coffey  01/07/2017      [facility] - [place], [place] - [id number] [place] [address] Phone: [phone] Fax: [phone]    Your procedure is scheduled on Tuesday, January 08, 2017  Report to [facility] Admitting Entrance "A" at 5:30AM   Call this number if you have problems the morning of surgery:  [phone]   Remember:  Do not eat food or drink liquids after midnight.  Take these medicines the morning of surgery with A SIP OF WATER: CARTIA XT.  Follow your doctor's instruction regarding Eliquis.  As of today, stop taking all Aspirins, Vitamins, Fish oils, and Herbal medications. Also stop all NSAIDS i.e. Advil, Ibuprofen, Motrin, Aleve, Anaprox, Naproxen, BC and Goody Powders.,COQ10,saw palmetto   Do not wear jewelry.  Do not wear lotions, powders, colognes, or deodorant.  Do not shave 48 hours prior to surgery.  Men may shave face and neck.  Do not bring valuables to the hospital.  [facility] is not responsible for any belongings or valuables.  Contacts, dentures or bridgework may not be worn into surgery.  Leave your suitcase in the car.  After surgery it may be brought to your room.  For patients admitted to the hospital, discharge time will be determined by your treatment team.  Patients discharged the day of surgery will not be allowed to drive home.   Special instructions:   [place]- Preparing For Surgery  Before surgery, you can play an important role. Because skin is not sterile, your skin needs to be as free of germs as possible. You can reduce the number of germs on your skin by washing with CHG (chlorahexidine gluconate) Soap before surgery.  CHG is an antiseptic cleaner which kills germs and bonds with the skin to continue killing germs even after washing.  Please do not use if you have an allergy to CHG or antibacterial soaps. If your skin becomes reddened/irritated stop using the CHG.   Do not shave (including legs and underarms) for at least 48 hours prior to first CHG shower. It is OK to shave your face.  Please follow these instructions carefully.   1. Shower the NIGHT BEFORE SURGERY and the MORNING OF SURGERY with CHG.   2. If you chose to wash your hair, wash your hair first as usual with your normal shampoo.  3. After you shampoo, rinse your hair and body thoroughly to remove the shampoo.  4. Use CHG as you would any other liquid soap. You can apply CHG directly to the skin and wash gently with a scrungie or a clean washcloth.   5. Apply the CHG Soap to your body ONLY FROM THE NECK DOWN.  Do not use on open wounds or open sores. Avoid contact with your eyes, ears, mouth and genitals (private parts). Wash Face and genitals (private parts)  with your normal soap.  6. Wash thoroughly, paying special attention to the area where your surgery will be performed.  7. Thoroughly rinse your body with warm water from the neck down.  8. DO NOT shower/wash with your normal soap after using and rinsing off the CHG Soap.  9. Pat yourself dry with a CLEAN TOWEL.  10. Wear CLEAN PAJAMAS to bed the night before surgery, wear comfortable clothes the morning of surgery  11. Place CLEAN SHEETS on your bed the night of your first shower and DO NOT SLEEP WITH PETS.  Day of  Surgery: Do not apply any deodorants/lotions. Please wear clean clothes to the hospital/surgery center.    Please read over the fact sheets that you were given.

## 2017-01-07 NOTE — Pre-Procedure Instructions (Signed)
Justin Coffey  01/07/2017      [facility] - [place], [place] - [id number] [place] [address] Phone: [phone] Fax: [phone]    Your procedure is scheduled on Tuesday, January 08, 2017  Report to [facility] Admitting Entrance "A" at 5:30AM   Call this number if you have problems the morning of surgery:  [phone]   Remember:  Do not eat food or drink liquids after midnight.  Take these medicines the morning of surgery with A SIP OF WATER: CARTIA XT.  Follow your doctor's instruction regarding Eliquis.  As of today, stop taking all Aspirins, Vitamins, Fish oils, and Herbal medications. Also stop all NSAIDS i.e. Advil, Ibuprofen, Motrin, Aleve, Anaprox, Naproxen, BC and Goody Powders.,COQ10,saw palmetto   Do not wear jewelry.  Do not wear lotions, powders, colognes, or deodorant.  Do not shave 48 hours prior to surgery.  Men may shave face and neck.  Do not bring valuables to the hospital.  [facility] is not responsible for any belongings or valuables.  Contacts, dentures or bridgework may not be worn into surgery.  Leave your suitcase in the car.  After surgery it may be brought to your room.  For patients admitted to the hospital, discharge time will be determined by your treatment team.  Patients discharged the day of surgery will not be allowed to drive home.   Special instructions:   [place]- Preparing For Surgery  Before surgery, you can play an important role. Because skin is not sterile, your skin needs to be as free of germs as possible. You can reduce the number of germs on your skin by washing with CHG (chlorahexidine gluconate) Soap before surgery.  CHG is an antiseptic cleaner which kills germs and bonds with the skin to continue killing germs even after washing.  Please do not use if you have an allergy to CHG or antibacterial soaps. If your skin becomes reddened/irritated stop using the CHG.   Do not shave (including legs and underarms) for at least 48 hours prior to first CHG shower. It is OK to shave your face.  Please follow these instructions carefully.   1. Shower the NIGHT BEFORE SURGERY and the MORNING OF SURGERY with CHG.   2. If you chose to wash your hair, wash your hair first as usual with your normal shampoo.  3. After you shampoo, rinse your hair and body thoroughly to remove the shampoo.  4. Use CHG as you would any other liquid soap. You can apply CHG directly to the skin and wash gently with a scrungie or a clean washcloth.   5. Apply the CHG Soap to your body ONLY FROM THE NECK DOWN.  Do not use on open wounds or open sores. Avoid contact with your eyes, ears, mouth and genitals (private parts). Wash Face and genitals (private parts)  with your normal soap.  6. Wash thoroughly, paying special attention to the area where your surgery will be performed.  7. Thoroughly rinse your body with warm water from the neck down.  8. DO NOT shower/wash with your normal soap after using and rinsing off the CHG Soap.  9. Pat yourself dry with a CLEAN TOWEL.  10. Wear CLEAN PAJAMAS to bed the night before surgery, wear comfortable clothes the morning of surgery  11. Place CLEAN SHEETS on your bed the night of your first shower and DO NOT SLEEP WITH PETS.  Day of  Surgery: Do not apply any deodorants/lotions. Please wear clean clothes to the hospital/surgery center.    Please read over the fact sheets that you were given.

## 2017-01-07 NOTE — Pre-Procedure Instructions (Signed)
Justin Coffey  01/07/2017      [facility] - [place], [place] - [id number] [place] [address] Phone: [phone] Fax: [phone]    Your procedure is scheduled on Tuesday, January 08, 2017  Report to [facility] Admitting Entrance "A" at 5:30AM   Call this number if you have problems the morning of surgery:  [phone]   Remember:  Do not eat food or drink liquids after midnight.  Take these medicines the morning of surgery with A SIP OF WATER: CARTIA XT.(takes at nite)  Follow your doctor's instruction regarding Eliquis.  As of today, stop taking all Aspirins, Vitamins, Fish oils, and Herbal medications. Also stop all NSAIDS i.e. Advil, Ibuprofen, Motrin, Aleve, Anaprox, Naproxen, BC and Goody Powders.,COQ10,saw palmetto   Do not wear jewelry.  Do not wear lotions, powders, colognes, or deodorant.  Do not shave 48 hours prior to surgery.  Men may shave face and neck.  Do not bring valuables to the hospital.  [facility] is not responsible for any belongings or valuables.  Contacts, dentures or bridgework may not be worn into surgery.  Leave your suitcase in the car.  After surgery it may be brought to your room.  For patients admitted to the hospital, discharge time will be determined by your treatment team.  Patients discharged the day of surgery will not be allowed to drive home.   Special instructions:   - Preparing For Surgery  Before surgery, you can play an important role. Because skin is not sterile, your skin needs to be as free of germs as possible. You can reduce the number of germs on your skin by washing with CHG (chlorahexidine gluconate) Soap before surgery.  CHG is an antiseptic cleaner which kills germs and bonds with the skin to continue killing germs even after washing.  Please do not use if you have an allergy to CHG or antibacterial soaps. If your skin becomes reddened/irritated stop  using the CHG.  Do not shave (including legs and underarms) for at least 48 hours prior to first CHG shower. It is OK to shave your face.  Please follow these instructions carefully.   1. Shower the NIGHT BEFORE SURGERY and the MORNING OF SURGERY with CHG.   2. If you chose to wash your hair, wash your hair first as usual with your normal shampoo.  3. After you shampoo, rinse your hair and body thoroughly to remove the shampoo.  4. Use CHG as you would any other liquid soap. You can apply CHG directly to the skin and wash gently with a scrungie or a clean washcloth.   5. Apply the CHG Soap to your body ONLY FROM THE NECK DOWN.  Do not use on open wounds or open sores. Avoid contact with your eyes, ears, mouth and genitals (private parts). Wash Face and genitals (private parts)  with your normal soap.  6. Wash thoroughly, paying special attention to the area where your surgery will be performed.  7. Thoroughly rinse your body with warm water from the neck down.  8. DO NOT shower/wash with your normal soap after using and rinsing off the CHG Soap.  9. Pat yourself dry with a CLEAN TOWEL.  10. Wear CLEAN PAJAMAS to bed the night before surgery, wear comfortable clothes the morning of surgery  11. Place CLEAN SHEETS on your bed the night of your first shower and DO NOT SLEEP WITH PETS.  Day of Surgery: Do not apply any deodorants/lotions. Please wear clean clothes to the hospital/surgery center.    Please read over the fact sheets that you were given.

## 2017-01-08 ENCOUNTER — Encounter (HOSPITAL_COMMUNITY)
Admission: RE | Disposition: A | Discharge status: Home / Self Care | Payer: Self-pay | Source: Ambulatory Visit | Attending: Cardiothoracic Surgery

## 2017-01-08 ENCOUNTER — Encounter (HOSPITAL_COMMUNITY): Payer: Self-pay | Admitting: *Deleted

## 2017-01-08 ENCOUNTER — Other Ambulatory Visit: Payer: Self-pay

## 2017-01-08 ENCOUNTER — Inpatient Hospital Stay (HOSPITAL_COMMUNITY): Payer: Medicare Other | Admitting: Vascular Surgery

## 2017-01-08 ENCOUNTER — Inpatient Hospital Stay (HOSPITAL_COMMUNITY): Payer: Medicare Other | Admitting: Certified Registered"

## 2017-01-08 ENCOUNTER — Ambulatory Visit (HOSPITAL_COMMUNITY): Payer: Medicare Other

## 2017-01-08 ENCOUNTER — Ambulatory Visit (HOSPITAL_COMMUNITY)
Admission: RE | Admit: 2017-01-08 | Discharge: 2017-01-08 | Disposition: A | Discharge status: Home / Self Care | Payer: Medicare Other | Source: Ambulatory Visit | Attending: Cardiothoracic Surgery | Admitting: Cardiothoracic Surgery

## 2017-01-08 DIAGNOSIS — Z801 Family history of malignant neoplasm of trachea, bronchus and lung: Secondary | ICD-10-CM | POA: Insufficient documentation

## 2017-01-08 DIAGNOSIS — Z09 Encounter for follow-up examination after completed treatment for conditions other than malignant neoplasm: Secondary | ICD-10-CM

## 2017-01-08 DIAGNOSIS — Z7901 Long term (current) use of anticoagulants: Secondary | ICD-10-CM | POA: Diagnosis not present

## 2017-01-08 DIAGNOSIS — R222 Localized swelling, mass and lump, trunk: Secondary | ICD-10-CM

## 2017-01-08 DIAGNOSIS — Q893 Situs inversus: Secondary | ICD-10-CM | POA: Insufficient documentation

## 2017-01-08 DIAGNOSIS — I10 Essential (primary) hypertension: Secondary | ICD-10-CM | POA: Insufficient documentation

## 2017-01-08 DIAGNOSIS — Z79899 Other long term (current) drug therapy: Secondary | ICD-10-CM | POA: Insufficient documentation

## 2017-01-08 DIAGNOSIS — Z86010 Personal history of colonic polyps: Secondary | ICD-10-CM | POA: Insufficient documentation

## 2017-01-08 DIAGNOSIS — E785 Hyperlipidemia, unspecified: Secondary | ICD-10-CM | POA: Insufficient documentation

## 2017-01-08 DIAGNOSIS — D171 Benign lipomatous neoplasm of skin and subcutaneous tissue of trunk: Secondary | ICD-10-CM | POA: Diagnosis present

## 2017-01-08 DIAGNOSIS — D1739 Benign lipomatous neoplasm of skin and subcutaneous tissue of other sites: Secondary | ICD-10-CM | POA: Diagnosis not present

## 2017-01-08 DIAGNOSIS — I4891 Unspecified atrial fibrillation: Secondary | ICD-10-CM | POA: Insufficient documentation

## 2017-01-08 DIAGNOSIS — Z9889 Other specified postprocedural states: Secondary | ICD-10-CM | POA: Insufficient documentation

## 2017-01-08 DIAGNOSIS — D367 Benign neoplasm of other specified sites: Secondary | ICD-10-CM | POA: Diagnosis not present

## 2017-01-08 DIAGNOSIS — R079 Chest pain, unspecified: Secondary | ICD-10-CM | POA: Diagnosis not present

## 2017-01-08 DIAGNOSIS — D1779 Benign lipomatous neoplasm of other sites: Secondary | ICD-10-CM | POA: Diagnosis not present

## 2017-01-08 DIAGNOSIS — D170 Benign lipomatous neoplasm of skin and subcutaneous tissue of head, face and neck: Secondary | ICD-10-CM | POA: Diagnosis not present

## 2017-01-08 HISTORY — PX: RESECTION OF MEDIASTINAL MASS: SHX6497

## 2017-01-08 SURGERY — EXCISION, MASS, MEDIASTINUM
Anesthesia: General | Site: Chest | Laterality: Left

## 2017-01-08 MED ORDER — DEXAMETHASONE SODIUM PHOSPHATE 10 MG/ML IJ SOLN
INTRAMUSCULAR | Status: AC
  Filled 2017-01-08: qty 1

## 2017-01-08 MED ORDER — DEXAMETHASONE SODIUM PHOSPHATE 10 MG/ML IJ SOLN
INTRAMUSCULAR | Status: DC | PRN
  Administered 2017-01-08: 10 mg via INTRAVENOUS

## 2017-01-08 MED ORDER — DEXTROSE 5 % IV SOLN
1.5000 g | INTRAVENOUS | Status: AC
Start: 2017-01-08 — End: 2017-01-08
  Administered 2017-01-08: 1.5 g via INTRAVENOUS

## 2017-01-08 MED ORDER — ONDANSETRON HCL 4 MG/2ML IJ SOLN
INTRAMUSCULAR | Status: AC
  Filled 2017-01-08: qty 2

## 2017-01-08 MED ORDER — OXYCODONE HCL 5 MG/5ML PO SOLN: 5.0000 mg | Freq: Once | ORAL | Status: DC | PRN

## 2017-01-08 MED ORDER — PROPOFOL 10 MG/ML IV BOLUS
INTRAVENOUS | Status: AC
  Filled 2017-01-08: qty 20

## 2017-01-08 MED ORDER — OXYCODONE HCL 5 MG PO TABS: 5.0000 mg | ORAL_TABLET | Freq: Once | ORAL | Status: DC | PRN

## 2017-01-08 MED ORDER — DEXTROSE 5 % IV SOLN
INTRAVENOUS | Status: AC
  Filled 2017-01-08: qty 1.5

## 2017-01-08 MED ORDER — FENTANYL CITRATE (PF) 100 MCG/2ML IJ SOLN
INTRAMUSCULAR | Status: DC | PRN
  Administered 2017-01-08: 100 ug via INTRAVENOUS
  Administered 2017-01-08: 50 ug via INTRAVENOUS

## 2017-01-08 MED ORDER — HYDROMORPHONE HCL 1 MG/ML IJ SOLN: 0.2500 mg | INTRAMUSCULAR | Status: DC | PRN

## 2017-01-08 MED ORDER — LIDOCAINE 2% (20 MG/ML) 5 ML SYRINGE
INTRAMUSCULAR | Status: AC
  Filled 2017-01-08: qty 5

## 2017-01-08 MED ORDER — MIDAZOLAM HCL 5 MG/5ML IJ SOLN
INTRAMUSCULAR | Status: DC | PRN
  Administered 2017-01-08: 2 mg via INTRAVENOUS

## 2017-01-08 MED ORDER — ROCURONIUM BROMIDE 10 MG/ML (PF) SYRINGE
PREFILLED_SYRINGE | INTRAVENOUS | Status: AC
  Filled 2017-01-08: qty 5

## 2017-01-08 MED ORDER — MIDAZOLAM HCL 2 MG/2ML IJ SOLN
INTRAMUSCULAR | Status: AC
  Filled 2017-01-08: qty 2

## 2017-01-08 MED ORDER — ROCURONIUM BROMIDE 10 MG/ML (PF) SYRINGE
PREFILLED_SYRINGE | INTRAVENOUS | Status: DC | PRN
  Administered 2017-01-08: 40 mg via INTRAVENOUS

## 2017-01-08 MED ORDER — 0.9 % SODIUM CHLORIDE (POUR BTL) OPTIME
TOPICAL | Status: DC | PRN
  Administered 2017-01-08: 2000 mL

## 2017-01-08 MED ORDER — OXYCODONE HCL 5 MG PO TABS: 5.0000 mg | ORAL_TABLET | ORAL | 0 refills | Status: DC | PRN

## 2017-01-08 MED ORDER — FENTANYL CITRATE (PF) 250 MCG/5ML IJ SOLN
INTRAMUSCULAR | Status: AC
  Filled 2017-01-08: qty 5

## 2017-01-08 MED ORDER — LACTATED RINGERS IV SOLN
INTRAVENOUS | Status: DC | PRN
  Administered 2017-01-08 (×2): via INTRAVENOUS

## 2017-01-08 MED ORDER — LIDOCAINE 2% (20 MG/ML) 5 ML SYRINGE
INTRAMUSCULAR | Status: DC | PRN
  Administered 2017-01-08: 100 mg via INTRAVENOUS

## 2017-01-08 MED ORDER — PROPOFOL 10 MG/ML IV BOLUS
INTRAVENOUS | Status: DC | PRN
  Administered 2017-01-08: 50 mg via INTRAVENOUS
  Administered 2017-01-08: 150 mg via INTRAVENOUS

## 2017-01-08 MED ORDER — ONDANSETRON HCL 4 MG/2ML IJ SOLN
INTRAMUSCULAR | Status: DC | PRN
  Administered 2017-01-08: 4 mg via INTRAVENOUS

## 2017-01-08 SURGICAL SUPPLY — 71 items
BAG DECANTER FOR FLEXI CONT (MISCELLANEOUS) IMPLANT
BANDAGE ACE 4X5 VEL STRL LF (GAUZE/BANDAGES/DRESSINGS) ×2 IMPLANT
BANDAGE ACE 6X5 VEL STRL LF (GAUZE/BANDAGES/DRESSINGS) IMPLANT
BLADE STERNUM SYSTEM 6 (BLADE) IMPLANT
BNDG GAUZE ELAST 4 BULKY (GAUZE/BANDAGES/DRESSINGS) IMPLANT
CANISTER SUCT 3000ML PPV (MISCELLANEOUS) ×2 IMPLANT
CATH CPB KIT GERHARDT (MISCELLANEOUS) IMPLANT
CATH THORACIC 28FR (CATHETERS) ×2 IMPLANT
CONT SPEC 4OZ CLIKSEAL STRL BL (MISCELLANEOUS) ×2 IMPLANT
COVER SURGICAL LIGHT HANDLE (MISCELLANEOUS) ×2 IMPLANT
CRADLE DONUT ADULT HEAD (MISCELLANEOUS) ×2 IMPLANT
DERMABOND ADVANCED (GAUZE/BANDAGES/DRESSINGS) ×1
DERMABOND ADVANCED .7 DNX12 (GAUZE/BANDAGES/DRESSINGS) ×1 IMPLANT
DRAIN CHANNEL 28F RND 3/8 FF (WOUND CARE) ×2 IMPLANT
DRAPE CARDIOVASCULAR INCISE (DRAPES) ×1
DRAPE SLUSH/WARMER DISC (DRAPES) ×2 IMPLANT
DRAPE SRG 135X102X78XABS (DRAPES) ×1 IMPLANT
DRSG AQUACEL AG ADV 3.5X14 (GAUZE/BANDAGES/DRESSINGS) IMPLANT
ELECT BLADE 4.0 EZ CLEAN MEGAD (MISCELLANEOUS) ×2
ELECT BLADE 6.5 EXT (BLADE) ×2 IMPLANT
ELECT CAUTERY BLADE 6.4 (BLADE) ×2 IMPLANT
ELECT REM PT RETURN 9FT ADLT (ELECTROSURGICAL) ×4
ELECTRODE BLDE 4.0 EZ CLN MEGD (MISCELLANEOUS) ×1 IMPLANT
ELECTRODE REM PT RTRN 9FT ADLT (ELECTROSURGICAL) ×2 IMPLANT
FELT TEFLON 1X6 (MISCELLANEOUS) IMPLANT
GAUZE SPONGE 4X4 12PLY STRL (GAUZE/BANDAGES/DRESSINGS) ×2 IMPLANT
GLOVE BIO SURGEON STRL SZ 6 (GLOVE) ×4 IMPLANT
GLOVE BIO SURGEON STRL SZ 6.5 (GLOVE) ×8 IMPLANT
GOWN STRL REUS W/ TWL LRG LVL3 (GOWN DISPOSABLE) ×4 IMPLANT
GOWN STRL REUS W/TWL LRG LVL3 (GOWN DISPOSABLE) ×4
HEMOSTAT POWDER SURGIFOAM 1G (HEMOSTASIS) IMPLANT
HEMOSTAT SURGICEL 2X14 (HEMOSTASIS) IMPLANT
HEMOSTAT SURGICEL 2X4 FIBR (HEMOSTASIS) ×2 IMPLANT
KIT BASIN OR (CUSTOM PROCEDURE TRAY) ×2 IMPLANT
KIT CATH SUCT 8FR (CATHETERS) IMPLANT
KIT ROOM TURNOVER OR (KITS) ×2 IMPLANT
KIT SUCTION CATH 14FR (SUCTIONS) IMPLANT
KIT VASOVIEW HEMOPRO VH 3000 (KITS) IMPLANT
LEAD PACING MYOCARDI (MISCELLANEOUS) IMPLANT
MARKER GRAFT CORONARY BYPASS (MISCELLANEOUS) IMPLANT
MARKER SKIN DUAL TIP RULER LAB (MISCELLANEOUS) ×2 IMPLANT
NS IRRIG 1000ML POUR BTL (IV SOLUTION) ×4 IMPLANT
PACK OPEN HEART (CUSTOM PROCEDURE TRAY) ×2 IMPLANT
PAD ARMBOARD 7.5X6 YLW CONV (MISCELLANEOUS) ×4 IMPLANT
PAD ELECT DEFIB RADIOL ZOLL (MISCELLANEOUS) ×2 IMPLANT
PENCIL BUTTON HOLSTER BLD 10FT (ELECTRODE) ×2 IMPLANT
SPONGE LAP 4X18 X RAY DECT (DISPOSABLE) ×2 IMPLANT
SUT BONE WAX W31G (SUTURE) IMPLANT
SUT PROLENE 3 0 SH1 36 (SUTURE) ×2 IMPLANT
SUT PROLENE 4 0 TF (SUTURE) IMPLANT
SUT PROLENE 6 0 CC (SUTURE) IMPLANT
SUT PROLENE 7 0 BV1 MDA (SUTURE) ×2 IMPLANT
SUT SILK 2 0 SH CR/8 (SUTURE) ×2 IMPLANT
SUT STEEL 6MS V (SUTURE) IMPLANT
SUT STEEL SZ 6 DBL 3X14 BALL (SUTURE) ×2 IMPLANT
SUT VIC AB 1 CTX 18 (SUTURE) ×2 IMPLANT
SUT VIC AB 2-0 CTX 27 (SUTURE) ×2 IMPLANT
SUT VIC AB 3-0 SH 8-18 (SUTURE) ×2 IMPLANT
SUT VIC AB 3-0 X1 27 (SUTURE) ×2 IMPLANT
SUT VIC AB 4-0 PS2 18 (SUTURE) ×2 IMPLANT
SUTURE E-PAK OPEN HEART (SUTURE) IMPLANT
SYSTEM SAHARA CHEST DRAIN ATS (WOUND CARE) ×2 IMPLANT
TOWEL GREEN STERILE (TOWEL DISPOSABLE) ×4 IMPLANT
TOWEL GREEN STERILE FF (TOWEL DISPOSABLE) ×2 IMPLANT
TOWEL OR 17X24 6PK STRL BLUE (TOWEL DISPOSABLE) ×2 IMPLANT
TOWEL OR 17X26 10 PK STRL BLUE (TOWEL DISPOSABLE) ×4 IMPLANT
TRAY FOLEY SILVER 16FR TEMP (SET/KITS/TRAYS/PACK) IMPLANT
TUBE CONNECTING 12X1/4 (SUCTIONS) ×2 IMPLANT
TUBING INSUFFLATION (TUBING) IMPLANT
UNDERPAD 30X30 (UNDERPADS AND DIAPERS) IMPLANT
WATER STERILE IRR 1000ML POUR (IV SOLUTION) ×4 IMPLANT

## 2017-01-08 NOTE — Discharge Instructions (Signed)
Resume  Eliquis on Thursday 01/10/2017      Wound Care, Adult Taking care of your wound properly can help to prevent pain and infection. It can also help your wound to heal more quickly. How is this treated? Wound care  Follow instructions from your health care provider about how to take care of your wound. Make sure you: ? Wash your hands with soap and water before you change the bandage (dressing). If soap and water are not available, use hand sanitizer. ? Change your dressing as told by your health care provider. ? Leave stitches (sutures), skin glue, or adhesive strips in place. These skin closures may need to stay in place for 2 weeks or longer. If adhesive strip edges start to loosen and curl up, you may trim the loose edges. Do not remove adhesive strips completely unless your health care provider tells you to do that.  Check your wound area every day for signs of infection. Check for: ? More redness, swelling, or pain. ? More fluid or blood. ? Warmth. ? Pus or a bad smell.  Ask your health care provider if you should clean the wound with mild soap and water. Doing this may include: ? Using a clean towel to pat the wound dry after cleaning it. Do not rub or scrub the wound. ? Applying a cream or ointment. Do this only as told by your health care provider. ? Covering the incision with a clean dressing.  Ask your health care provider when you can leave the wound uncovered. Medicines   If you were prescribed an antibiotic medicine, cream, or ointment, take or use the antibiotic as told by your health care provider. Do not stop taking or using the antibiotic even if your condition improves.  Take over-the-counter and prescription medicines only as told by your health care provider. If you were prescribed pain medicine, take it at least 30 minutes before doing any wound care or as told by your health care provider. General instructions  Return to your normal activities as told by  your health care provider. Ask your health care provider what activities are safe.  Do not scratch or pick at the wound.  Keep all follow-up visits as told by your health care provider. This is important.  Eat a diet that includes protein, vitamin A, vitamin C, and other nutrient-rich foods. These help the wound heal: ? Protein-rich foods include meat, dairy, beans, nuts, and other sources. ? Vitamin A-rich foods include carrots and dark green, leafy vegetables. ? Vitamin C-rich foods include citrus, tomatoes, and other fruits and vegetables. ? Nutrient-rich foods have protein, carbohydrates, fat, vitamins, or minerals. Eat a variety of healthy foods including vegetables, fruits, and whole grains. Contact a health care provider if:  You received a tetanus shot and you have swelling, severe pain, redness, or bleeding at the injection site.  Your pain is not controlled with medicine.  You have more redness, swelling, or pain around the wound.  You have more fluid or blood coming from the wound.  Your wound feels warm to the touch.  You have pus or a bad smell coming from the wound.  You have a fever or chills.  You are nauseous or you vomit.  You are dizzy. Get help right away if:  You have a red streak going away from your wound.  The edges of the wound open up and separate.  Your wound is bleeding and the bleeding does not stop with gentle pressure.  You have a rash.  You faint.  You have trouble breathing. This information is not intended to replace advice given to you by your health care provider. Make sure you discuss any questions you have with your health care provider. Document Released: 11/08/2007 Document Revised: 09/28/2015 Document Reviewed: 08/16/2015 Elsevier Interactive Patient Education  2017 [employer] Anesthesia, Adult, Care After These instructions provide you with information about caring for yourself after your procedure. Your health  care provider may also give you more specific instructions. Your treatment has been planned according to current medical practices, but problems sometimes occur. Call your health care provider if you have any problems or questions after your procedure. What can I expect after the procedure? After the procedure, it is common to have:  Vomiting.  A sore throat.  Mental slowness.  It is common to feel:  Nauseous.  Cold or shivery.  Sleepy.  Tired.  Sore or achy, even in parts of your body where you did not have surgery.  Follow these instructions at home: For at least 24 hours after the procedure:  Do not: ? Participate in activities where you could fall or become injured. ? Drive. ? Use heavy machinery. ? Drink alcohol. ? Take sleeping pills or medicines that cause drowsiness. ? Make important decisions or sign legal documents. ? Take care of children on your own.  Rest. Eating and drinking  If you vomit, drink water, juice, or soup when you can drink without vomiting.  Drink enough fluid to keep your urine clear or pale yellow.  Make sure you have little or no nausea before eating solid foods.  Follow the diet recommended by your health care provider. General instructions  Have a responsible adult stay with you until you are awake and alert.  Return to your normal activities as told by your health care provider. Ask your health care provider what activities are safe for you.  Take over-the-counter and prescription medicines only as told by your health care provider.  If you smoke, do not smoke without supervision.  Keep all follow-up visits as told by your health care provider. This is important. Contact a health care provider if:  You continue to have nausea or vomiting at home, and medicines are not helpful.  You cannot drink fluids or start eating again.  You cannot urinate after 8-12 hours.  You develop a skin rash.  You have fever.  You have  increasing redness at the site of your procedure. Get help right away if:  You have difficulty breathing.  You have chest pain.  You have unexpected bleeding.  You feel that you are having a life-threatening or urgent problem. This information is not intended to replace advice given to you by your health care provider. Make sure you discuss any questions you have with your health care provider. Document Released: 05/07/2000 Document Revised: 07/04/2015 Document Reviewed: 01/13/2015 Elsevier Interactive Patient Education  2018 [employer].   Not lifting over 20 lbs for 2 weeks

## 2017-01-08 NOTE — Transfer of Care (Signed)
Immediate Anesthesia Transfer of Care Note  Patient: Justin Coffey  Procedure(s) Performed: RESECTION OF LEFT SUPRACLVICULAR MASS - Lipoma - 9cm (Left Chest)  Patient Location: PACU  Anesthesia Type:General  Level of Consciousness: awake, oriented and patient cooperative  Airway & Oxygen Therapy: Patient Spontanous Breathing and Patient connected to nasal cannula oxygen  Post-op Assessment: Report given to RN, Post -op Vital signs reviewed and stable and Patient moving all extremities  Post vital signs: Reviewed and stable  Last Vitals:  Vitals:   01/08/17 0601 01/08/17 1013  BP: (!) 155/80   Pulse: (!) 58   Resp: 20   Temp: 36.9 C 36.5 C  SpO2: 98%     Last Pain:  Vitals:   01/08/17 0601  TempSrc: Oral      Patients Stated Pain Goal: 10 (81/44/81 8563)  Complications: No apparent anesthesia complications
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
## 2017-01-08 NOTE — Brief Op Note (Signed)
      [address]       [place] [address]             [phone]      01/08/2017  10:07 AM  PATIENT:  Justin Coffey  71 y.o. male  PRE-OPERATIVE DIAGNOSIS:  Left Supraclavicular lipoma- 9 cm  POST-OPERATIVE DIAGNOSIS:  Same   PROCEDURE:  Procedure(s): RESECTION OF LEFT SUPRACLVICULAR MASS - Lipoma - 9cm (Left)  SURGEON:  Surgeon(s) and Role:    * [name], MD - Primary   ASSISTANTS: Loanne Drilling RNFA  ANESTHESIA:   general  EBL:  50 mL   BLOOD ADMINISTERED:none  DRAINS: none   LOCAL MEDICATIONS USED:  NONE  SPECIMEN:  Source of Specimen:  left supraclavicular lipoma  DISPOSITION OF SPECIMEN:  PATHOLOGY  COUNTS:  YES   DICTATION: .Dragon Dictation  PLAN OF CARE: Discharge to home after PACU  PATIENT DISPOSITION:  PACU - hemodynamically stable.   Delay start of Pharmacological VTE agent (>24hrs) due to surgical blood loss or risk of bleeding: yes

## 2017-01-08 NOTE — Anesthesia Preprocedure Evaluation (Addendum)
Anesthesia Evaluation  Patient identified by MRN, date of birth, ID band Patient awake    Reviewed: Allergy & Precautions, NPO status , Patient's Chart, lab work & pertinent test results  Airway Mallampati: I  TM Distance: >3 FB Neck ROM: Full    Dental  (+) Teeth Intact   Pulmonary neg pulmonary ROS,    breath sounds clear to auscultation       Cardiovascular hypertension, Pt. on medications + dysrhythmias  Rhythm:Regular Rate:Normal     Neuro/Psych negative neurological ROS     GI/Hepatic Neg liver ROS, GERD  ,  Endo/Other  negative endocrine ROS  Renal/GU negative Renal ROS     Musculoskeletal   Abdominal   Peds  Hematology negative hematology ROS (+)   Anesthesia Other Findings   Reproductive/Obstetrics                           Anesthesia Physical Anesthesia Plan  ASA: II  Anesthesia Plan: General   Post-op Pain Management:    Induction: Intravenous  PONV Risk Score and Plan: 3 and Treatment may vary due to age or medical condition, Dexamethasone and Ondansetron  Airway Management Planned: LMA  Additional Equipment: None  Intra-op Plan:   Post-operative Plan: Extubation in OR  Informed Consent: I have reviewed the patients History and Physical, chart, labs and discussed the procedure including the risks, benefits and alternatives for the proposed anesthesia with the patient or authorized representative who has indicated his/her understanding and acceptance.   Dental advisory given  Plan Discussed with: CRNA, Anesthesiologist and Surgeon  Anesthesia Plan Comments:        Anesthesia Quick Evaluation

## 2017-01-08 NOTE — Anesthesia Procedure Notes (Signed)
Procedure Name: Intubation Date/Time: 01/08/2017 7:36 AM Performed by: Moshe Salisbury, CRNA Pre-anesthesia Checklist: Patient identified, Emergency Drugs available, Suction available and Patient being monitored Patient Re-evaluated:Patient Re-evaluated prior to induction Oxygen Delivery Method: Circle System Utilized Preoxygenation: Pre-oxygenation with 100% oxygen Induction Type: IV induction Ventilation: Mask ventilation without difficulty LMA: LMA inserted LMA Size: 5.0 Laryngoscope Size: Mac and 4 Grade View: Grade I Tube type: Oral Tube size: 8.0 mm Number of attempts: 1 Airway Equipment and Method: Stylet Placement Confirmation: ETT inserted through vocal cords under direct vision,  positive ETCO2 and breath sounds checked- equal and bilateral Secured at: 22 cm Tube secured with: Tape Dental Injury: Teeth and Oropharynx as per pre-operative assessment  Comments: 5 LMA placed easily with leak at 20 cm H2O, leak worsened with neck rotation. Decision made to convert to ETT

## 2017-01-08 NOTE — Anesthesia Postprocedure Evaluation (Signed)
Anesthesia Post Note  Patient: Justin Coffey  Procedure(s) Performed: RESECTION OF LEFT SUPRACLVICULAR MASS - Lipoma - 9cm (Left Chest)     Patient location during evaluation: PACU Anesthesia Type: General Level of consciousness: awake and alert Pain management: pain level controlled Vital Signs Assessment: post-procedure vital signs reviewed and stable Respiratory status: spontaneous breathing, nonlabored ventilation, respiratory function stable and patient connected to nasal cannula oxygen Cardiovascular status: blood pressure returned to baseline and stable Postop Assessment: no apparent nausea or vomiting Anesthetic complications: no    Last Vitals:  Vitals:   01/08/17 1130 01/08/17 1200  BP: 134/76   Pulse: 66   Resp:    Temp:    SpO2: 97% 98%    Last Pain:  Vitals:   01/08/17 1246  TempSrc:   PainSc: 0-No pain                 Indy Kuck,JAMES TERRILL

## 2017-01-09 ENCOUNTER — Encounter (HOSPITAL_COMMUNITY): Payer: Self-pay | Admitting: Cardiothoracic Surgery

## 2017-01-09 NOTE — Op Note (Signed)
NAME:  Justin Coffey, Justin Coffey                    ACCOUNT NO.:  MEDICAL RECORD NO.:  [id number]  LOCATION:                                 FACILITY:  PHYSICIAN:  [name], MD         DATE OF BIRTH:  DATE OF PROCEDURE:  01/08/2017 DATE OF DISCHARGE:                              OPERATIVE REPORT   PREOPERATIVE DIAGNOSIS:  9-cm lipoma, left supraclavicular fossa.  POSTOPERATIVE DIAGNOSIS:  9-cm lipoma, left supraclavicular fossa.  SURGICAL PROCEDURE:  Resection of 9-cm lipoma, left supraclavicular fossa, under general anesthesia.  SURGEON:  [name], MD.  FIRST ASSISTANT:  Miki Kins, RNFA.  BRIEF HISTORY:  The patient is a 71 year old male who for the last 8-10 years has noted a soft rubbery mass in his left supraclavicular fossa. This mass over the years has progressively enlarged until most recent studies where the MRI confirmed likely lipoma measuring 9 cm.  The patient notes that at this point the mass has become large enough that it interferes with him and is uncomfortable to turn his neck or use his left arm, especially as he elevates it.  Because of the continued discomfort, the patient had sought attention from the ENT Service and General Surgery, both who declined involvement.  Because of his persistent symptoms, he was seen by Thoracic Surgery and MRI was obtained to further characterize the mass.  On MRI, it appeared to be most consistent with a lipoma without nerve or vessel involvement. Risks and options of surgery were discussed with the patient.  He is on Eliquis for paroxysmal atrial fibrillation.  This was held preoperatively.  Because of the size of the mass, it was elected to proceed with surgery under general anesthesia.  The patient was agreeable with this.  The patient signed informed consent.  DESCRIPTION OF PROCEDURE:  The patient underwent general endotracheal anesthesia without incident.  The left neck, shoulder, and upper chest were prepped  with Betadine and draped in usual sterile manner.  He was placed in slightly sitting position to maximize the palpation and exposure of the mass.  After appropriate time-out was performed and the patient had been adequately prepped and draped sterilely, we proceeded with a horizontal incision over the top of the lipoma, anterior to the trapezius muscle, but above the clavicle.  We went through the platysma and then dissected in a tedious manner the lipoma from its supraclavicular fossa taking care to stay on the capsule of the lipoma to avoid any nerve or vessel injury or injury to the pleura.  With a fairly tedious dissection, we were able to remove the lipoma in its entirety and was submitted to Pathology for examination.  Careful examination of the supraclavicular fossa for any bleeding was carried out.  A small piece of Fibrillar was placed in the cavity for hemostasis.  With operative field hemostatic, the incision was closed in the deep layer with interrupted 3-0 Vicryl in the subcutaneous tissue with a running 3-0 subcutaneous tissue.  Dermabond was applied. Estimated blood loss was less than 50 mL.  The patient tolerated the procedure without obvious complication.  He was extubated in the operating room,  transferred to the recovery room for further postoperative care.  Sponge and needle count was reported as correct at the completion of the procedure.  The patient tolerated the procedure without obvious complication and was transferred to the recovery room for postoperative observation.     [name], MD     EG/MEDQ  D:  01/08/2017  T:  01/08/2017  Job:  [id number]

## 2017-01-15 ENCOUNTER — Telehealth: Payer: Self-pay

## 2017-01-15 NOTE — Telephone Encounter (Signed)
Patient called wanting to know results of pathology.  Patient expressed he did not want to wait until follow-up appointment to discuss results.  Results given to patient and he will keep follow-up appointment with Dr. Servando Snare.

## 2017-01-23 ENCOUNTER — Other Ambulatory Visit: Payer: Self-pay | Admitting: Cardiothoracic Surgery

## 2017-01-23 DIAGNOSIS — R222 Localized swelling, mass and lump, trunk: Secondary | ICD-10-CM

## 2017-01-24 ENCOUNTER — Other Ambulatory Visit: Payer: Self-pay

## 2017-01-24 ENCOUNTER — Ambulatory Visit
Admission: RE | Admit: 2017-01-24 | Discharge: 2017-01-24 | Disposition: A | Discharge status: Home / Self Care | Payer: Medicare Other | Source: Ambulatory Visit | Attending: Cardiothoracic Surgery | Admitting: Cardiothoracic Surgery

## 2017-01-24 ENCOUNTER — Encounter: Payer: Self-pay | Admitting: Cardiothoracic Surgery

## 2017-01-24 ENCOUNTER — Ambulatory Visit (INDEPENDENT_AMBULATORY_CARE_PROVIDER_SITE_OTHER): Payer: Self-pay | Admitting: Cardiothoracic Surgery

## 2017-01-24 VITALS — BP 151/81 | HR 64 | Ht 71.0 in | Wt 206.0 lb

## 2017-01-24 DIAGNOSIS — R222 Localized swelling, mass and lump, trunk: Secondary | ICD-10-CM

## 2017-01-24 DIAGNOSIS — Z09 Encounter for follow-up examination after completed treatment for conditions other than malignant neoplasm: Secondary | ICD-10-CM

## 2017-01-24 NOTE — Progress Notes (Signed)
[address]       [place] [address]             [phone]      Justin Coffey [facility] Medical Record #[id number] Date of Birth: August 08, 1945  Referring: Justin Kussmaul, MD Primary Care: Justin Mc, MD  Chief Complaint:   POST OP FOLLOW UP 01/08/2017 OPERATIVE REPORT PREOPERATIVE DIAGNOSIS:  9-cm lipoma, left supraclavicular fossa. POSTOPERATIVE DIAGNOSIS:  9-cm lipoma, left supraclavicular fossa. SURGICAL PROCEDURE:  Resection of 9-cm lipoma, left supraclavicular fossa, under general anesthesia. SURGEON:  Justin Bal, MD.  Diagnosis Soft tissue, lipoma, Left supraclavicular - MATURE ADIPOSE TISSUE, CONSISTENT WITH LIPOMA. Justin Cutter MD History of Present Illness:     Patient doing well following surgery.  Notes he can move his arm better now than before on the left.  He wants to get out and start playing golf again     Past Medical History:  Diagnosis Date  . Atrial fibrillation ([place])   . Colon polyps   . Dysrhythmia    atrial fib  . Esophageal ring 03/16/08   s/p esophageal dilation Savary 43mm Dr Justin Coffey   . GERD (gastroesophageal reflux disease)    hx  . HTN (hypertension)   . Hyperlipemia   . Situs inversus   . Tubular adenoma 12/30/2007   removed from sigmoid colon     Social History   Tobacco Use  Smoking Status Never Smoker  Smokeless Tobacco Never Used    Social History   Substance and Sexual Activity  Alcohol Use No     No Known Allergies  Current Outpatient Medications  Medication Sig Dispense Refill  . apixaban (ELIQUIS) 5 MG TABS tablet Take 5 mg by mouth 2 (two) times daily.    Marland Kitchen atorvastatin (LIPITOR) 20 MG tablet Take 20 mg by mouth daily.    Marland Kitchen CARTIA XT 180 MG 24 hr capsule Take 180 mg by mouth daily.    . Cholecalciferol (VITAMIN D3) 5000 units TABS Take 5,000 Units daily by mouth.     . Coenzyme Q10 100 MG capsule Take 100 mg by mouth daily.    Marland Kitchen losartan (COZAAR) 100 MG tablet  Take 100 mg by mouth daily.    . Multiple Vitamin (MULTIVITAMIN) tablet Take 1 tablet by mouth daily.    . Saw Palmetto, Serenoa repens, (SAW PALMETTO PO) Take 272 mg daily by mouth.     No current facility-administered medications for this visit.        Physical Exam: BP (!) 151/81 (BP Location: Left Arm, Patient Position: Sitting, Cuff Size: Large)   Pulse 64   Ht 5\' 11"  (1.803 m)   Wt 206 lb (93.4 kg)   SpO2 98%   BMI 28.73 kg/m   General appearance: alert and cooperative Neurologic: intact Heart: regular rate and rhythm, S1, S2 normal, no murmur, click, rub or gallop Lungs: clear to auscultation bilaterally Abdomen: soft, non-tender; bowel sounds normal; no masses,  no organomegaly Extremities: extremities normal, atraumatic, no cyanosis or edema and Homans sign is negative, no sign of DVT Wound: Left supraclavicular fossa incision is well-healed   Diagnostic Studies & Laboratory data:     Recent Radiology Findings:   Dg Chest 2 View  Result Date: 01/24/2017 CLINICAL DATA:  Left supraclavicular mass resection. EXAM: CHEST  2 VIEW COMPARISON:  Multiple previous chest x-rays and MRI of the left chest. FINDINGS: Stable situs inversus totalis is again demonstrated. The heart is  normal in size. The mediastinal and hilar contours are normal. The lungs are clear. No pleural effusion. Resolved surgical changes in the left supraclavicular fossa region. The bony structures are normal. IMPRESSION: No acute cardiopulmonary findings. Electronically Signed   By: Justin Coffey M.D.   On: 01/24/2017 10:28      Recent Lab Findings: Lab Results  Component Value Date   WBC 10.4 01/07/2017   HGB 15.7 01/07/2017   HCT 44.7 01/07/2017   PLT 161 01/07/2017   GLUCOSE 112 (H) 01/07/2017   ALT 25 01/07/2017   AST 22 01/07/2017   NA 137 01/07/2017   K 4.3 01/07/2017   CL 106 01/07/2017   CREATININE 1.01 01/07/2017   BUN 20 01/07/2017   CO2 23 01/07/2017   INR 1.04 01/07/2017       Assessment / Plan:      Patient doing well postoperatively he was cautioned about heavy lifting for several more weeks, but is now returned to near normal activities Pathology report was reviewed with him Plan to see him back as needed      Justin Isaac MD      [address] [place] [address] Office [phone]   Beeper [phone]  01/24/2017 10:40 AM

## 2017-06-14 ENCOUNTER — Other Ambulatory Visit: Payer: Self-pay | Admitting: Cardiology

## 2017-06-14 MED ORDER — APIXABAN 5 MG PO TABS: 5.0000 mg | ORAL_TABLET | Freq: Two times a day (BID) | ORAL | 1 refills | Status: DC

## 2017-06-14 MED ORDER — CARTIA XT 180 MG PO CP24: 180.0000 mg | ORAL_CAPSULE | Freq: Every day | ORAL | 1 refills | Status: DC

## 2017-06-14 NOTE — Telephone Encounter (Signed)
Refill sent in

## 2017-06-14 NOTE — Telephone Encounter (Signed)
Pt calling requesting a refill on Eliquis, sent to [facility] order pharmacy. Please address

## 2017-08-14 DIAGNOSIS — Z6829 Body mass index (BMI) 29.0-29.9, adult: Secondary | ICD-10-CM | POA: Diagnosis not present

## 2017-08-14 DIAGNOSIS — Z125 Encounter for screening for malignant neoplasm of prostate: Secondary | ICD-10-CM | POA: Diagnosis not present

## 2017-08-14 DIAGNOSIS — M791 Myalgia, unspecified site: Secondary | ICD-10-CM | POA: Diagnosis not present

## 2017-08-14 DIAGNOSIS — E7849 Other hyperlipidemia: Secondary | ICD-10-CM | POA: Diagnosis not present

## 2017-08-14 DIAGNOSIS — Z0001 Encounter for general adult medical examination with abnormal findings: Secondary | ICD-10-CM | POA: Diagnosis not present

## 2017-08-14 DIAGNOSIS — I4891 Unspecified atrial fibrillation: Secondary | ICD-10-CM | POA: Diagnosis not present

## 2017-08-14 DIAGNOSIS — I10 Essential (primary) hypertension: Secondary | ICD-10-CM | POA: Diagnosis not present

## 2017-08-14 DIAGNOSIS — E782 Mixed hyperlipidemia: Secondary | ICD-10-CM | POA: Diagnosis not present

## 2017-08-14 DIAGNOSIS — Z1389 Encounter for screening for other disorder: Secondary | ICD-10-CM | POA: Diagnosis not present

## 2017-08-14 DIAGNOSIS — R7309 Other abnormal glucose: Secondary | ICD-10-CM | POA: Diagnosis not present

## 2017-08-26 DIAGNOSIS — E041 Nontoxic single thyroid nodule: Secondary | ICD-10-CM | POA: Diagnosis not present

## 2017-08-26 DIAGNOSIS — E782 Mixed hyperlipidemia: Secondary | ICD-10-CM | POA: Diagnosis not present

## 2017-08-26 DIAGNOSIS — Z0001 Encounter for general adult medical examination with abnormal findings: Secondary | ICD-10-CM | POA: Diagnosis not present

## 2017-08-26 DIAGNOSIS — Z139 Encounter for screening, unspecified: Secondary | ICD-10-CM | POA: Diagnosis not present

## 2017-09-06 ENCOUNTER — Telehealth: Payer: Self-pay | Admitting: Cardiology

## 2017-09-06 MED ORDER — DILTIAZEM HCL ER COATED BEADS 180 MG PO CP24: 180.0000 mg | ORAL_CAPSULE | Freq: Every day | ORAL | 3 refills | Status: DC

## 2017-09-06 NOTE — Telephone Encounter (Signed)
[facility] pharmacy is requesting a alternative therapy because medication Cartia XT 180 24Hr tablet is on mfr backordered. Pharmacy would like to know if Dr. Curt Bears would like to authorize generic Diltiazem CD 180 24 Hr retaining all other information as is. Please address

## 2017-09-06 NOTE — Telephone Encounter (Signed)
Spoke with patient and confirmed that he did not have any problems with diltiazem. I refilled the generic form so the patient can get there medication. The original prescription was ordered as written, so it was discontinued and reordered. Due to the pharmacy being backordered on the brand name Cardia XT.

## 2017-09-07 DIAGNOSIS — H2513 Age-related nuclear cataract, bilateral: Secondary | ICD-10-CM | POA: Diagnosis not present

## 2017-09-07 DIAGNOSIS — H04123 Dry eye syndrome of bilateral lacrimal glands: Secondary | ICD-10-CM | POA: Diagnosis not present

## 2017-10-01 ENCOUNTER — Other Ambulatory Visit: Payer: Self-pay | Admitting: Dermatology

## 2017-10-01 DIAGNOSIS — D485 Neoplasm of uncertain behavior of skin: Secondary | ICD-10-CM | POA: Diagnosis not present

## 2017-10-01 DIAGNOSIS — D229 Melanocytic nevi, unspecified: Secondary | ICD-10-CM | POA: Diagnosis not present

## 2017-10-01 DIAGNOSIS — L959 Vasculitis limited to the skin, unspecified: Secondary | ICD-10-CM | POA: Diagnosis not present

## 2017-10-01 DIAGNOSIS — L820 Inflamed seborrheic keratosis: Secondary | ICD-10-CM | POA: Diagnosis not present

## 2017-11-12 DIAGNOSIS — Z23 Encounter for immunization: Secondary | ICD-10-CM | POA: Diagnosis not present

## 2017-11-30 ENCOUNTER — Other Ambulatory Visit: Payer: Self-pay | Admitting: Cardiology

## 2017-12-06 ENCOUNTER — Encounter: Payer: Self-pay | Admitting: *Deleted

## 2017-12-17 DIAGNOSIS — Z1389 Encounter for screening for other disorder: Secondary | ICD-10-CM | POA: Diagnosis not present

## 2017-12-17 DIAGNOSIS — K439 Ventral hernia without obstruction or gangrene: Secondary | ICD-10-CM | POA: Diagnosis not present

## 2017-12-17 DIAGNOSIS — E6609 Other obesity due to excess calories: Secondary | ICD-10-CM | POA: Diagnosis not present

## 2017-12-17 DIAGNOSIS — D179 Benign lipomatous neoplasm, unspecified: Secondary | ICD-10-CM | POA: Diagnosis not present

## 2017-12-17 DIAGNOSIS — Z6830 Body mass index (BMI) 30.0-30.9, adult: Secondary | ICD-10-CM | POA: Diagnosis not present

## 2017-12-24 ENCOUNTER — Encounter: Payer: Self-pay | Admitting: *Deleted

## 2017-12-30 ENCOUNTER — Encounter: Payer: Self-pay | Admitting: Cardiology

## 2017-12-30 ENCOUNTER — Ambulatory Visit (INDEPENDENT_AMBULATORY_CARE_PROVIDER_SITE_OTHER): Payer: Medicare Other | Admitting: Cardiology

## 2017-12-30 VITALS — BP 132/74 | HR 60 | Ht 71.0 in | Wt 221.0 lb

## 2017-12-30 DIAGNOSIS — I10 Essential (primary) hypertension: Secondary | ICD-10-CM

## 2017-12-30 DIAGNOSIS — I480 Paroxysmal atrial fibrillation: Secondary | ICD-10-CM

## 2017-12-30 DIAGNOSIS — E785 Hyperlipidemia, unspecified: Secondary | ICD-10-CM

## 2017-12-30 NOTE — Progress Notes (Signed)
Electrophysiology Office Note   Date:  12/30/2017   ID:  Justin Coffey, DOB 03-15-1945, MRN [id number]  PCP:  Justin Sites, MD  Cardiologist:  [name] Primary Electrophysiologist:  Constance Haw, MD    No chief complaint on file.    History of Present Illness: Justin Coffey is a 72 y.o. male who presents today for electrophysiology evaluation.   He has a history of hypertension, atrial fibrillation, and situs inversus. He had the sudden onset of palpitations while camping mid-September. He felt that his heart was jumping around. His symptoms woken from sleep. He drove himself to the emergency room but converted to sinus rhythm without need for cardioversion. He has had atrial fibrillation in the past when he was in his 96s in the setting of an electrocution.   Today, denies symptoms of palpitations, chest pain, shortness of breath, orthopnea, PND, lower extremity edema, claudication, dizziness, presyncope, syncope, bleeding, or neurologic sequela. The patient is tolerating medications without difficulties.  Overall he is doing well.  He is to do no further episodes of atrial fibrillation.  He is tolerating his rate control and Eliquis without issue.   Past Medical History:  Diagnosis Date  . Atrial fibrillation ([place])   . Colon polyps   . Dysrhythmia    atrial fib  . Esophageal ring 03/16/08   s/p esophageal dilation Savary 71mm Dr [name]   . GERD (gastroesophageal reflux disease)    hx  . HTN (hypertension)   . Hyperlipemia   . Situs inversus   . Tubular adenoma 12/30/2007   removed from sigmoid colon   Past Surgical History:  Procedure Laterality Date  . COLONOSCOPY N/A 08/08/2015   Procedure: COLONOSCOPY;  Surgeon: Justin Binder, MD;  [facility];  Service: Endoscopy;  Laterality: N/A;  830  . ESOPHAGOGASTRODUODENOSCOPY N/A 08/08/2015   Procedure: ESOPHAGOGASTRODUODENOSCOPY (EGD);  Surgeon: Justin Binder, MD;  Location[facility];  Service: Endoscopy;  Laterality: N/A;  . EXTERNAL EAR SURGERY Right    skin graft - eardrum  . RESECTION OF MEDIASTINAL MASS Left 01/08/2017   Procedure: RESECTION OF LEFT SUPRACLVICULAR MASS - Lipoma - 9cm;  Surgeon: Justin Isaac, MD;  Location: [facility];  Service: Thoracic;  Laterality: Left;  . TONSILLECTOMY    . TRIGGER FINGER RELEASE Left 01/14/2013   Procedure: RELEASE A-1 PULLEY LEFT THUMB,MIDDLE AND RING FINGER;  Surgeon: Justin Sours, MD;  Location: [facility];  Service: Orthopedics;  Laterality: Left;     Current Outpatient Medications  Medication Sig Dispense Refill  . atorvastatin (LIPITOR) 20 MG tablet Take 20 mg by mouth daily.    . Cholecalciferol (VITAMIN D3) 5000 units TABS Take 5,000 Units daily by mouth.     . diltiazem (CARDIZEM CD) 180 MG 24 hr capsule Take 1 capsule (180 mg total) by mouth daily. 90 capsule 3  . ELIQUIS 5 MG TABS tablet TAKE 1 TABLET TWICE A DAY 180 tablet 1  . losartan (COZAAR) 100 MG tablet Take 100 mg by mouth daily.    . Multiple Vitamin (MULTIVITAMIN) tablet Take 1 tablet by mouth daily.    . Saw Palmetto, Serenoa repens, (SAW PALMETTO PO) Take 272 mg daily by mouth.     No current facility-administered medications for this visit.     Allergies:   Patient has no known allergies.   Social History:  The patient  reports that he has never smoked. He has never used smokeless  tobacco. He reports that he does not drink alcohol or use drugs.   Family History:  The patient's family history includes Healthy in his sister and sister; Lung cancer in his father and mother.    ROS:  Please see the history of present illness.   Otherwise, review of systems is positive for none.   All other systems are reviewed and negative.   PHYSICAL EXAM: VS:  BP 132/74   Pulse 60   Ht 5\' 11"  (1.803 m)   Wt 221 lb (100.2 kg)   BMI 30.82 kg/m  , BMI Body mass index is 30.82 kg/m. GEN: Well nourished, well developed, in no acute distress    HEENT: normal  Neck: no JVD, carotid bruits, or masses Cardiac: RRR; no murmurs, rubs, or gallops,no edema  Respiratory:  clear to auscultation bilaterally, normal work of breathing GI: soft, nontender, nondistended, + BS MS: no deformity or atrophy  Skin: warm and dry Neuro:  Strength and sensation are intact Psych: euthymic mood, full affect  EKG:  EKG is ordered today. Personal review of the ekg ordered shows sinus rhythm, rate 60, right superior axis  Recent Labs: 01/07/2017: ALT 25; BUN 20; Creatinine, Ser 1.01; Hemoglobin 15.7; Platelets 161; Potassium 4.3; Sodium 137    Lipid Panel  No results found for: CHOL, TRIG, HDL, CHOLHDL, VLDL, LDLCALC, LDLDIRECT   Wt Readings from Last 3 Encounters:  12/30/17 221 lb (100.2 kg)  01/24/17 206 lb (93.4 kg)  01/07/17 206 lb 12.8 oz (93.8 kg)      Other studies Reviewed: Additional studies/ records that were reviewed today include: 10/18/15 TTE Review of the above records today demonstrates:  The left ventricular size is normal. Basal left ventricular septal hypertrophy  Left ventricular systolic function is mildly reduced.  Left ventricular filling pattern is impaired. No segmental wall motion abnormalities seen in the left ventricle The right ventricular systolic function is normal. There is no significant valvular stenosis or regurgitation There is no pericardial effusion. The left atrial size is normal.  ASSESSMENT AND PLAN:  1.  Paroxysmal atrial fibrillation: Eliquis for stroke prevention and diltiazem for rate control.  No further episodes of atrial fibrillation.  No changes.  This patients CHA2DS2-VASc Score and unadjusted Ischemic Stroke Rate (% per year) is equal to 2.2 % stroke rate/year from a score of 2  Above score calculated as 1 point each if present [CHF, HTN, DM, Vascular=MI/PAD/Aortic Plaque, Age if 65-74, or Male] Above score calculated as 2 points each if present [Age > 75, or Stroke/TIA/TE]   2.  Hypertension: Currently well controlled  3. Hyperlipidemia: Statin   Current medicines are reviewed at length with the patient today.   The patient does not have concerns regarding his medicines.  The following changes were made today: None  Labs/ tests ordered today include:  Orders Placed This Encounter  Procedures  . EKG 12-Lead     Disposition:   FU with Venicia Vandall 1 year  Signed, Quentyn Kolbeck Meredith Leeds, MD  12/30/2017 9:52 AM     [facility] [address] [place] [address] [phone] (office) [phone] (fax)

## 2017-12-30 NOTE — Patient Instructions (Signed)
Medication Instructions:  Your physician recommends that you continue on your current medications as directed. Please refer to the Current Medication list given to you today.  If you need a refill on your cardiac medications before your next appointment, please call your pharmacy.   Lab work: None ordered  Testing/Procedures: None ordered  Follow-Up: At [facility], you and your health needs are our priority.  As part of our continuing mission to provide you with exceptional heart care, we have created designated Provider Care Teams.  These Care Teams include your primary Cardiologist (physician) and Advanced Practice Providers (APPs -  Physician Assistants and Nurse Practitioners) who all work together to provide you with the care you need, when you need it. You will need a follow up appointment in 1 years.  Please call our office 2 months in advance to schedule this appointment.  You may see Will Meredith Leeds, MD or one of the following Advanced Practice Providers on your designated Care Team:   Chanetta Marshall, NP . Tommye Standard, PA-C  Thank you for choosing [facility]!!   Trinidad Curet, RN [phone]

## 2018-02-21 IMAGING — CR DG CHEST 2V
2 series · 2 of 2 positions shown · non-contrast
Comparison: 10/19/2009

CLINICAL DATA: Preop supraclavicular mass.

EXAM:
CHEST  2 VIEW

[w chest pa]
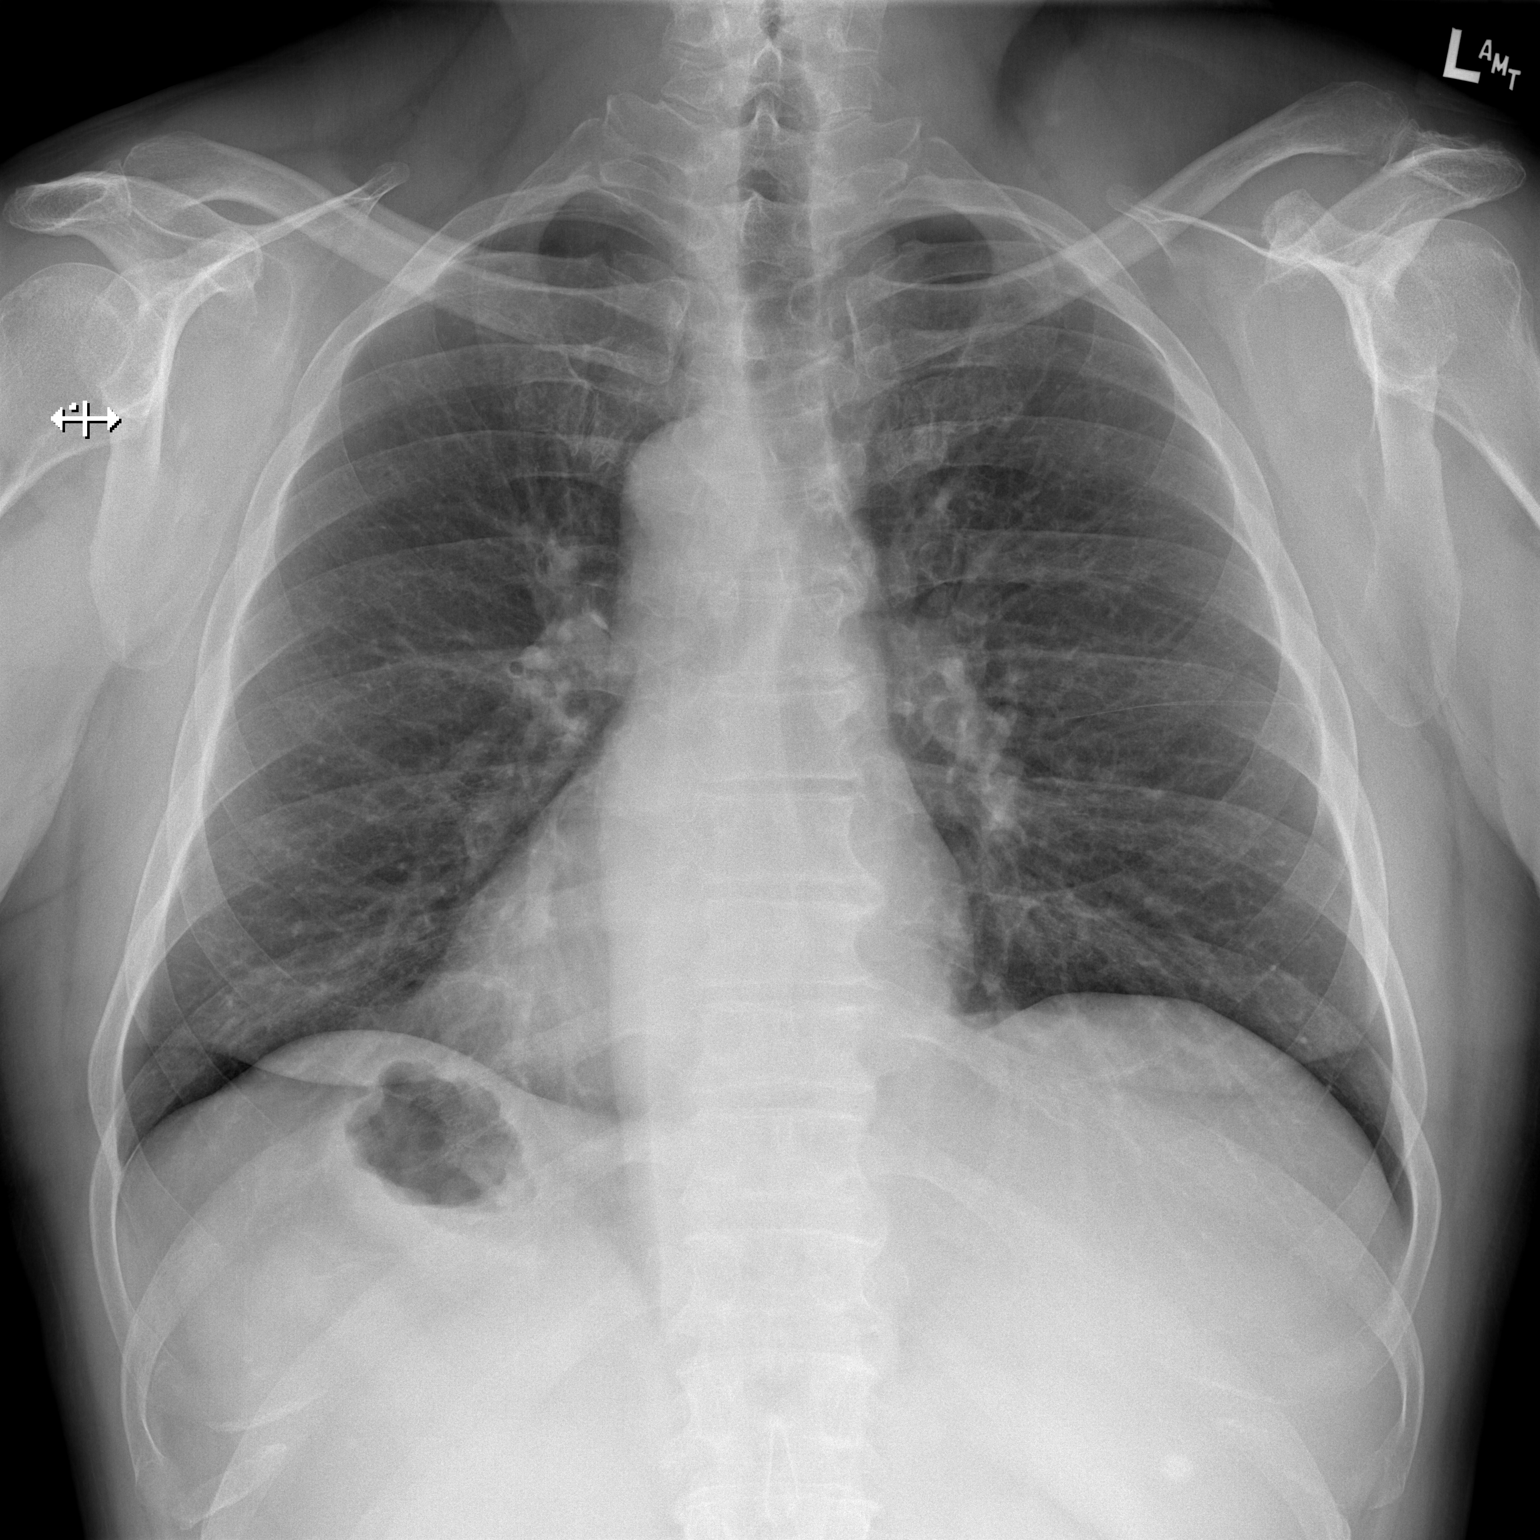

[w chest lat]
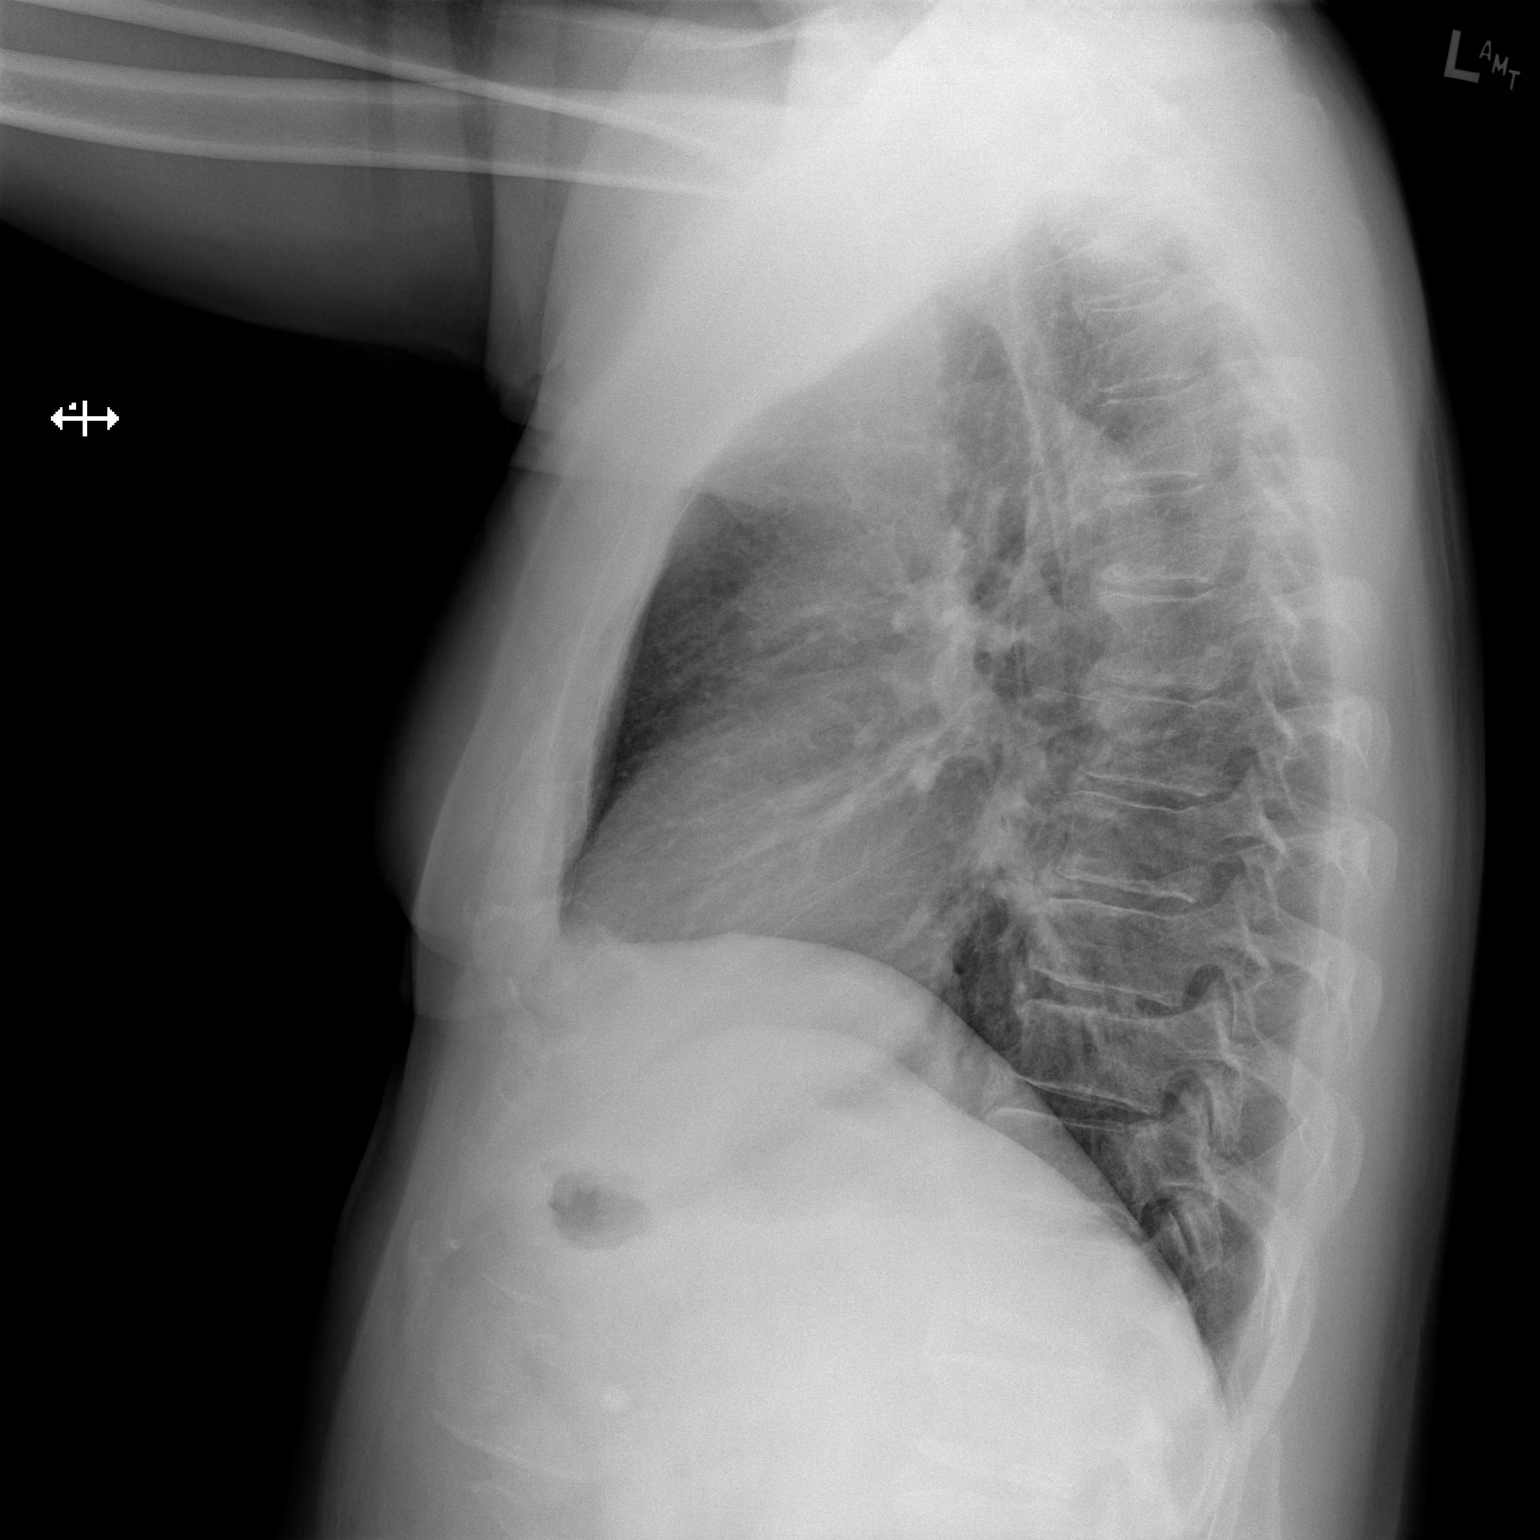

[2 of 2 positions shown; findings below may reference images not displayed]

FINDINGS: Known situs inversus. Heart is normal size. Lungs are clear. No
effusions or acute bony abnormality.
IMPRESSION: No active cardiopulmonary disease.  Known situs versus.

## 2018-02-22 IMAGING — CR DG CHEST 2V
2 series · 2 of 2 positions shown · non-contrast
Comparison: 01/07/2017.

CLINICAL DATA: Left shoulder lipoma removal today.

EXAM:
CHEST  2 VIEW

[w chest pa]
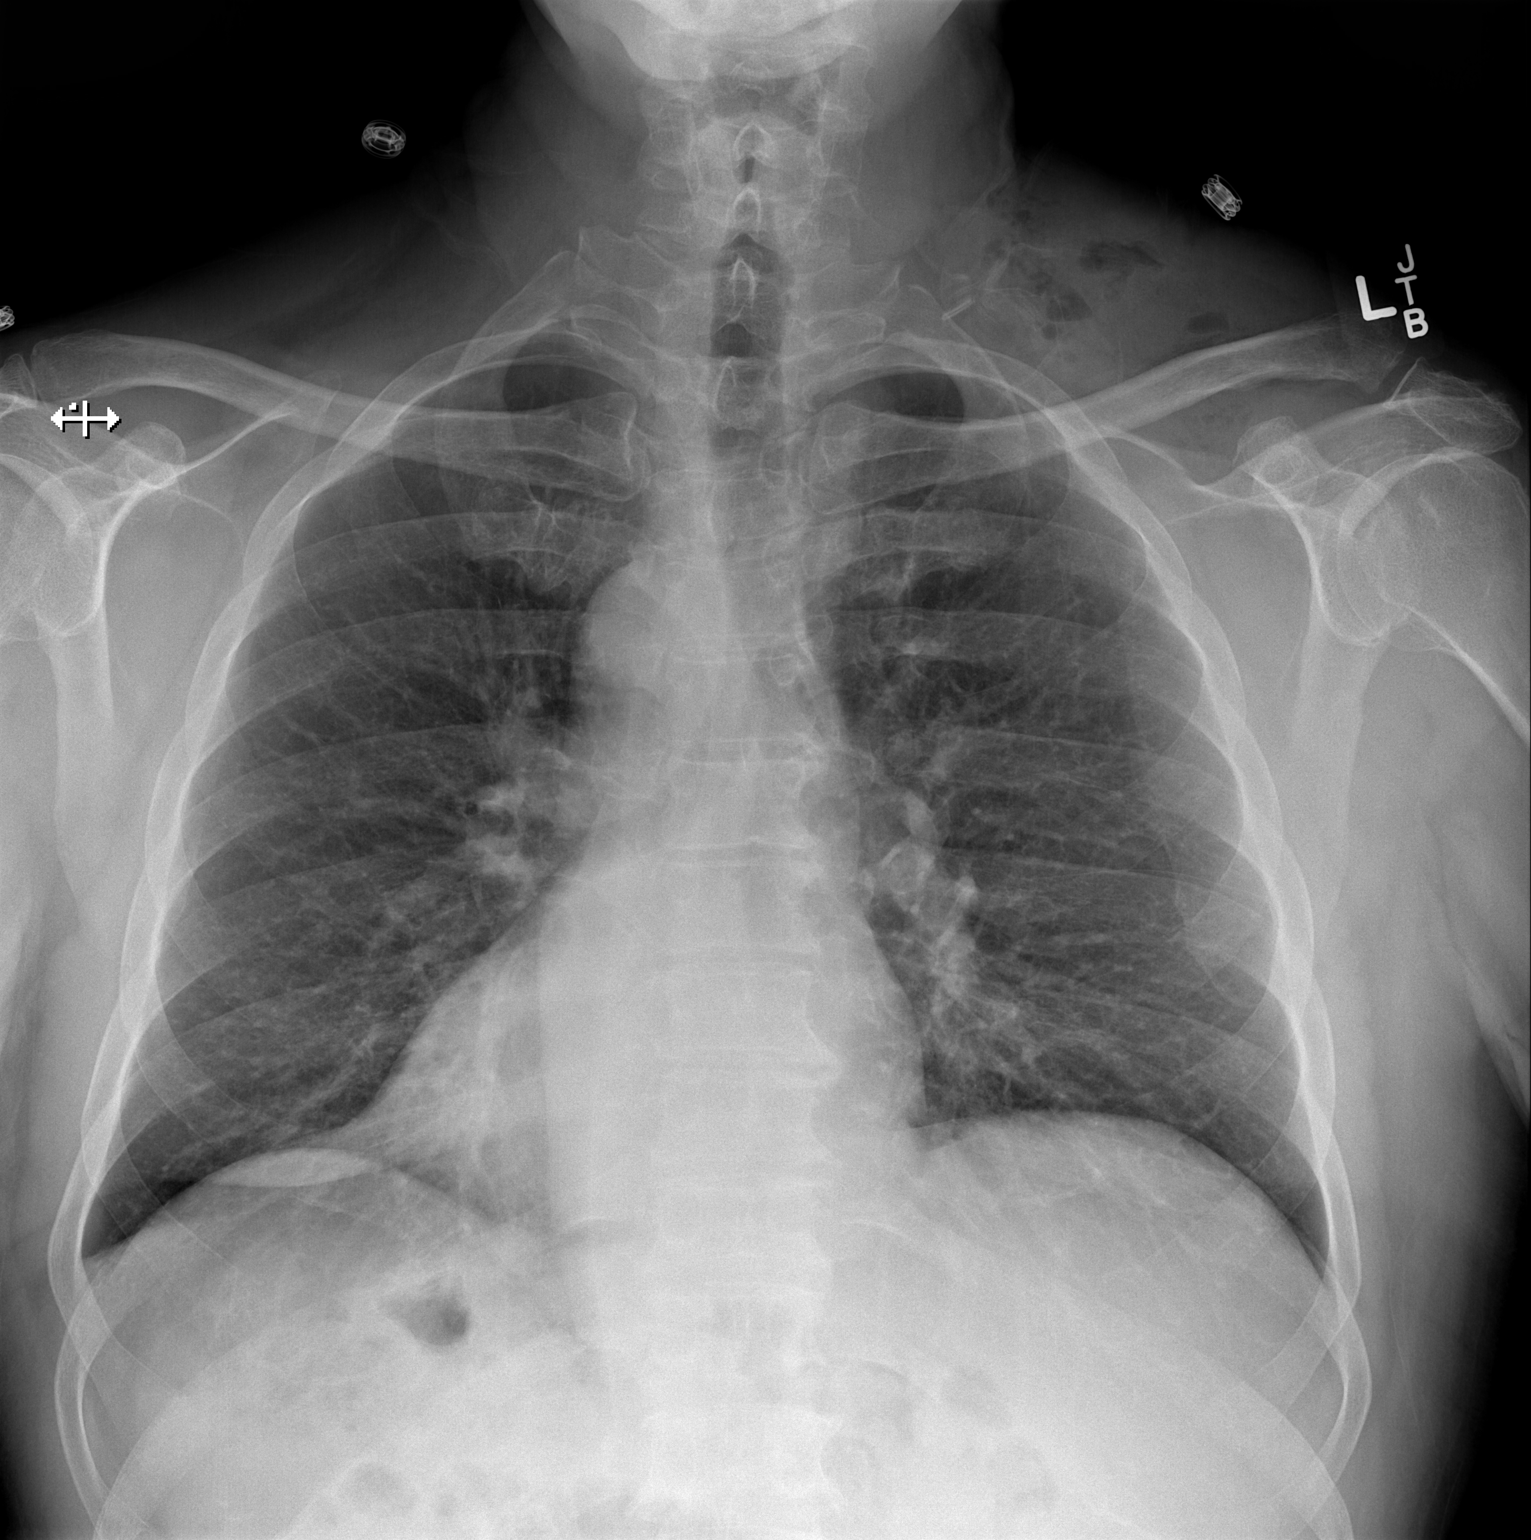

[w chest lat]
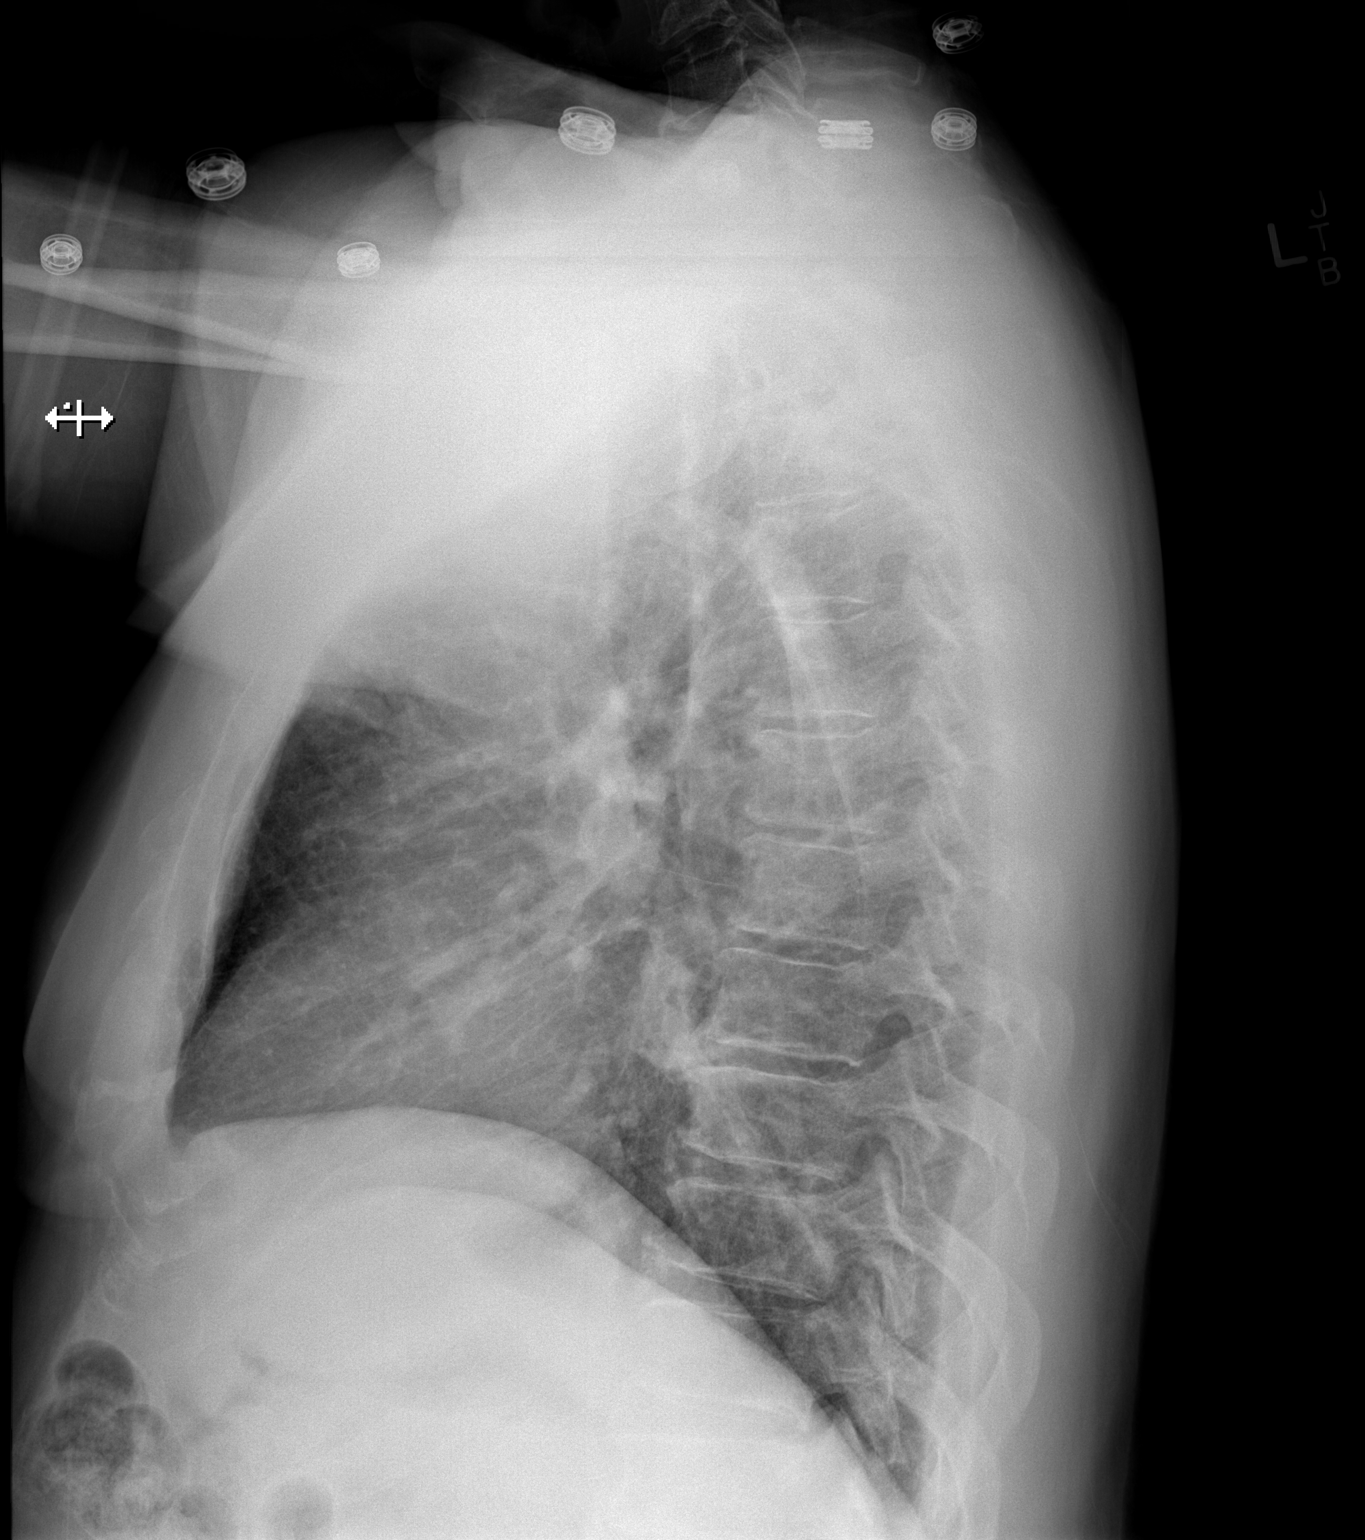

[2 of 2 positions shown; findings below may reference images not displayed]

FINDINGS: No ascites versus. Heart size normal. Lungs are clear. No pleural
effusion or pneumothorax. Surgical clip at left supraclavicular
region. Soft tissue air noted in the left supraclavicular region,
this is most likely from recent surgery. Clinical correlation
suggested.
IMPRESSION: 1. Soft tissue air left supraclavicular region. This is most likely
from today surgery.

2. No acute cardiopulmonary disease.  No ascites and versus .

## 2018-03-10 IMAGING — DX DG CHEST 2V
2 series · 2 of 2 positions shown · non-contrast
Comparison: Multiple previous chest x-rays and MRI of the left
chest.

CLINICAL DATA: Left supraclavicular mass resection.

EXAM:
CHEST  2 VIEW

[dg chest 2 view (1 of 2)]
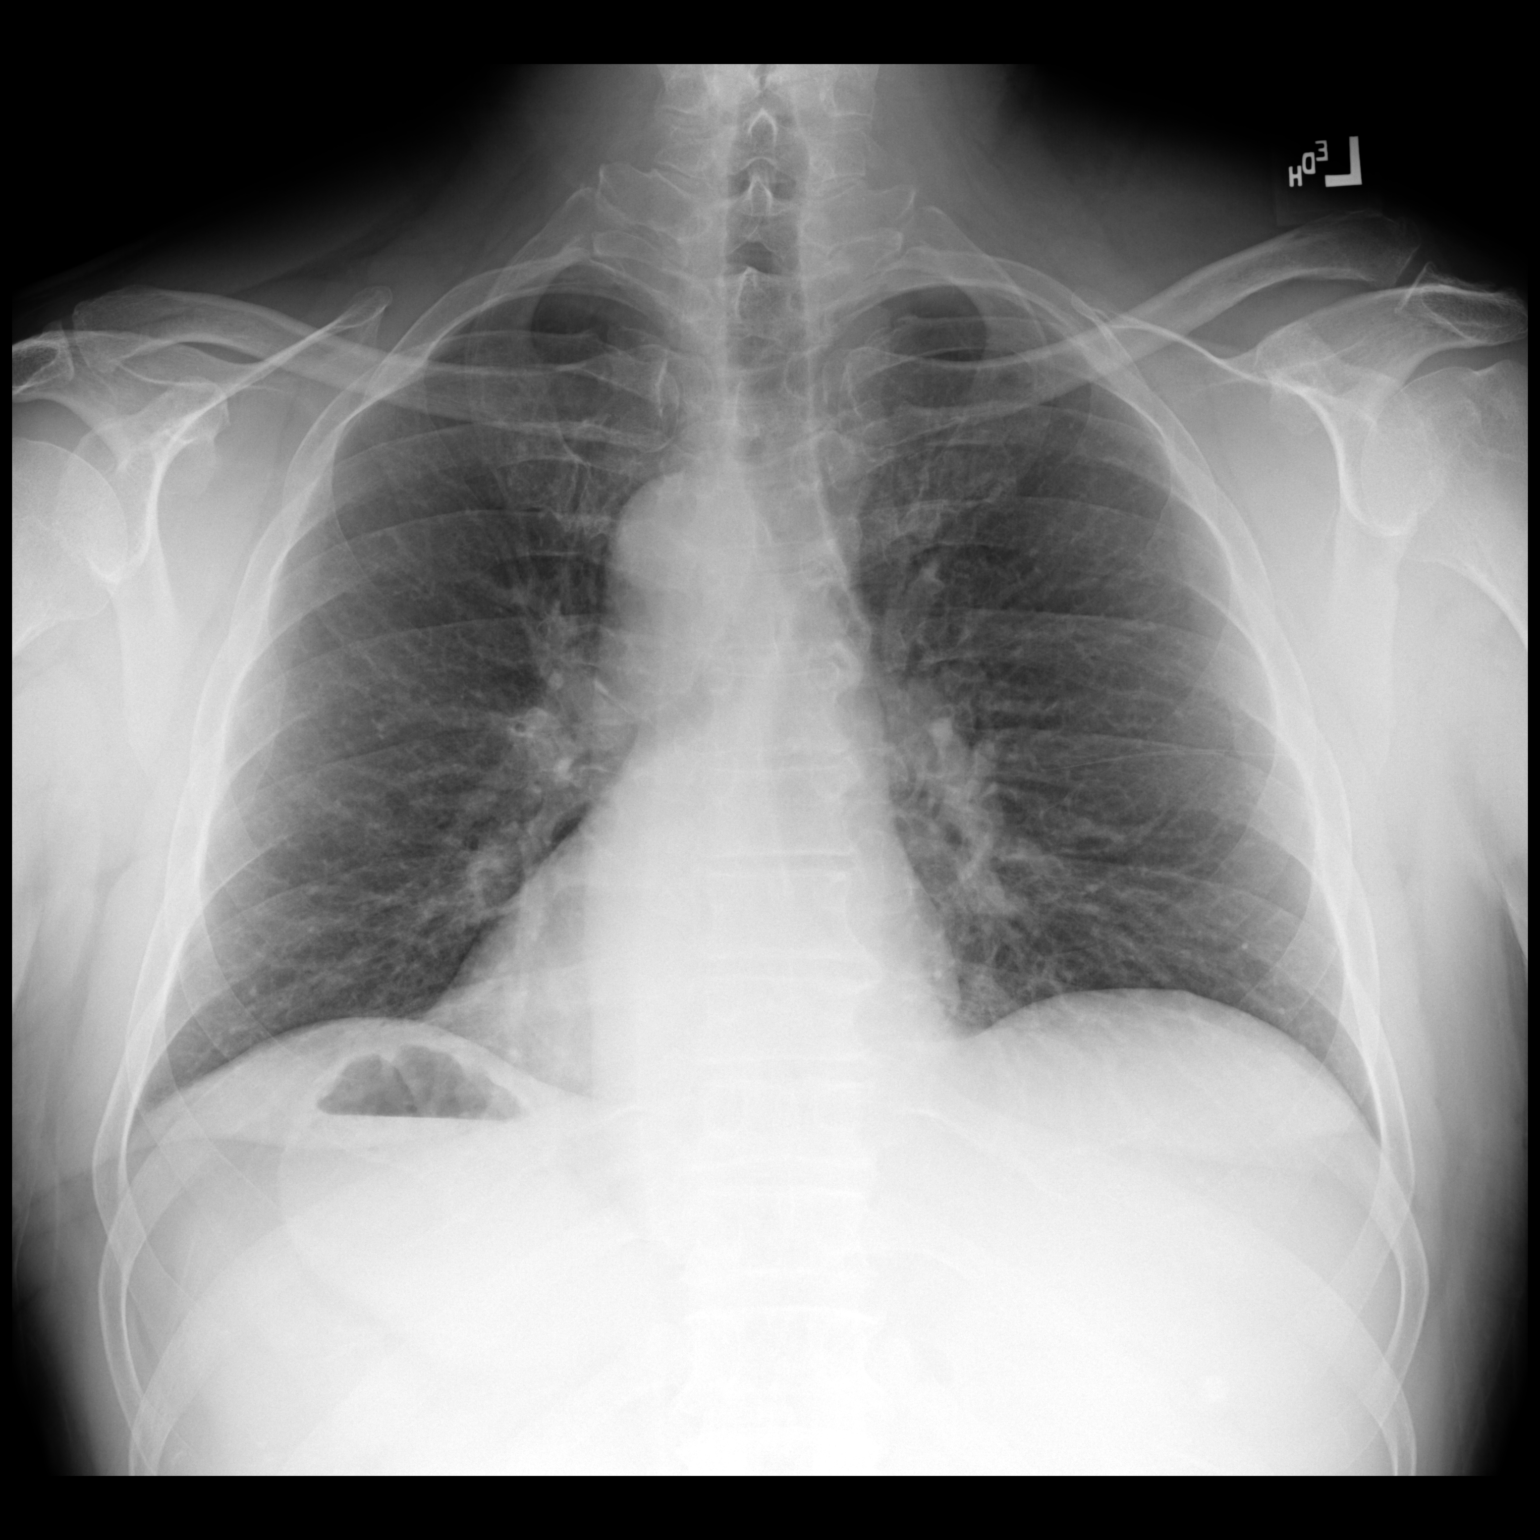

[dg chest 2 view (2 of 2)]
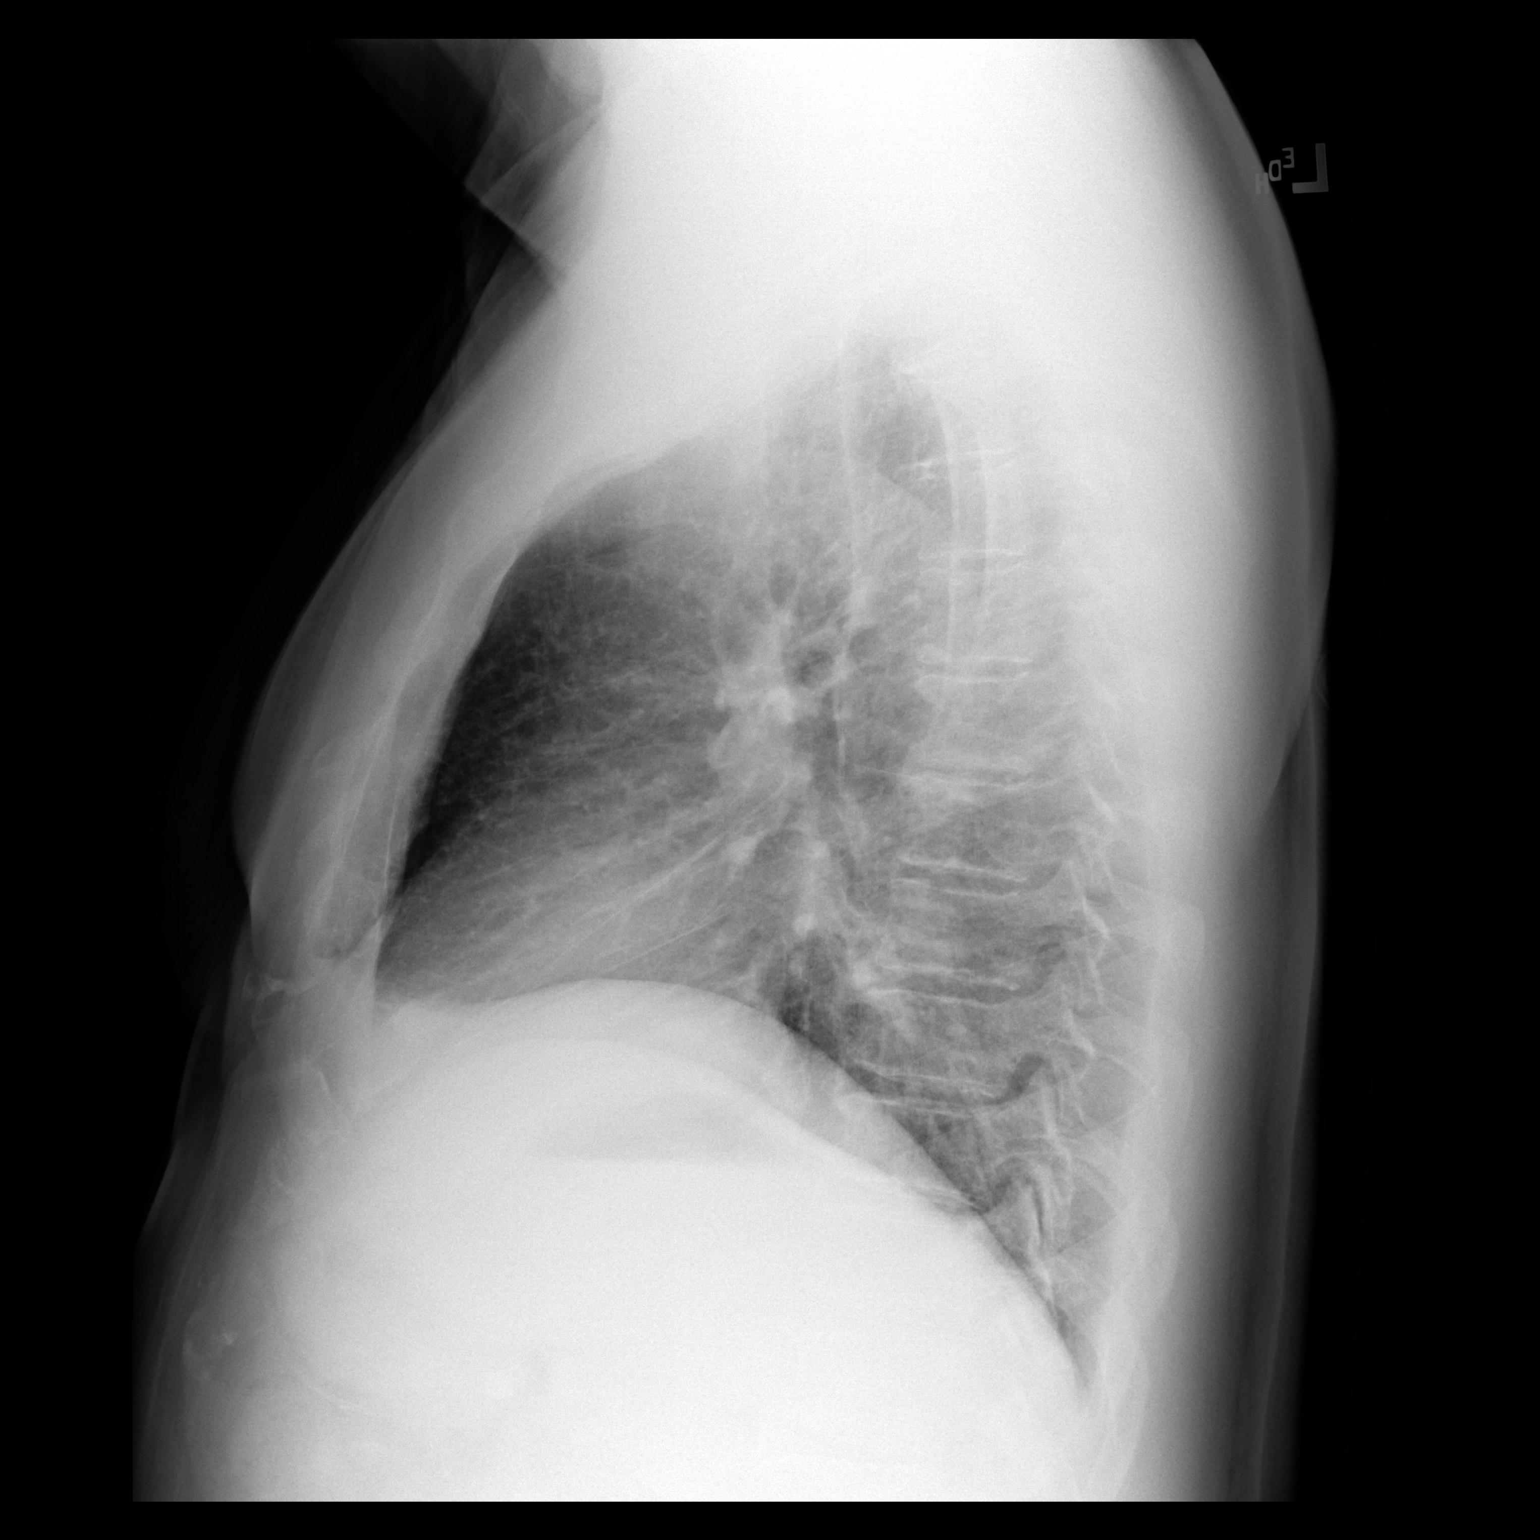

[2 of 2 positions shown; findings below may reference images not displayed]

FINDINGS: Stable situs inversus totalis is again demonstrated. The heart is
normal in size. The mediastinal and hilar contours are normal. The
lungs are clear. No pleural effusion.

Resolved surgical changes in the left supraclavicular fossa region.
The bony structures are normal.
IMPRESSION: No acute cardiopulmonary findings.

## 2018-05-12 ENCOUNTER — Other Ambulatory Visit: Payer: Self-pay | Admitting: Cardiology

## 2018-05-12 NOTE — Telephone Encounter (Signed)
Eliquis 5mg  refill request received; pt is 73 yrs old, wt-100.2kg, Crea-1.11 on 08/14/2017 via [facility], last seen by Dr. Curt Bears on 12/30/2017; will send in refill.

## 2018-06-03 ENCOUNTER — Telehealth: Payer: Self-pay | Admitting: Cardiology

## 2018-06-03 NOTE — Telephone Encounter (Signed)
New Message:  Patient would like to make appt. Please call patient.

## 2018-06-24 ENCOUNTER — Telehealth: Payer: Self-pay | Admitting: Cardiology

## 2018-06-24 NOTE — Telephone Encounter (Signed)
Followed up with pt. Pt having elevated HRs, they are "jumping up and down" Scheduled for virtual visit with Dr. Curt Bears this Thrusday per [facility] request. Pt agreeable to teleheatlh visit     Virtual Visit Pre-Appointment Phone Call  "(Name), I am calling you today to discuss your upcoming appointment. We are currently trying to limit exposure to the virus that causes COVID-19 by seeing patients at home rather than in the office."  1. "What is the BEST phone number to call the day of the visit?" - include this in appointment notes  2. "Do you have or have access to (through a family member/friend) a smartphone with video capability that we can use for your visit?" a. If yes - list this number in appt notes as "cell" (if different from BEST phone #) and list the appointment type as a VIDEO visit in appointment notes b. If no - list the appointment type as a PHONE visit in appointment notes  3. Confirm consent - "In the setting of the current Covid19 crisis, you are scheduled for a (phone or video) visit with your provider on (date) at (time).  Just as we do with many in-office visits, in order for you to participate in this visit, we must obtain consent.  If you'd like, I can send this to your mychart (if signed up) or email for you to review.  Otherwise, I can obtain your verbal consent now.  All virtual visits are billed to your insurance company just like a normal visit would be.  By agreeing to a virtual visit, we'd like you to understand that the technology does not allow for your provider to perform an examination, and thus may limit your provider's ability to fully assess your condition. If your provider identifies any concerns that need to be evaluated in person, we will make arrangements to do so.  Finally, though the technology is pretty good, we cannot assure that it will always work on either your or our end, and in the setting of a video visit, we may have to convert it to a phone-only  visit.  In either situation, we cannot ensure that we have a secure connection.  Are you willing to proceed?" STAFF: Did the patient verbally acknowledge consent to telehealth visit? Document YES/NO here: YES  4. Advise patient to be prepared - "Two hours prior to your appointment, go ahead and check your blood pressure, pulse, oxygen saturation, and your weight (if you have the equipment to check those) and write them all down. When your visit starts, your provider will ask you for this information. If you have an Apple Watch or Kardia device, please plan to have heart rate information ready on the day of your appointment. Please have a pen and paper handy nearby the day of the visit as well."  5. Give patient instructions for MyChart download to smartphone OR Doximity/Doxy.me as below if video visit (depending on what platform provider is using)  6. Inform patient they will receive a phone call 15 minutes prior to their appointment time (may be from unknown caller ID) so they should be prepared to answer    TELEPHONE CALL NOTE  Justin Coffey has been deemed a candidate for a follow-up tele-health visit to limit community exposure during the Covid-19 pandemic. I spoke with the patient via phone to ensure availability of phone/video source, confirm preferred email & phone number, and discuss instructions and expectations.  I reminded Justin Coffey to be prepared with  any vital sign and/or heart rhythm information that could potentially be obtained via home monitoring, at the time of his visit. I reminded Justin Coffey to expect a phone call prior to his visit.  Stanton Kidney, RN 06/24/2018 4:58 PM   INSTRUCTIONS FOR DOWNLOADING THE MYCHART APP TO SMARTPHONE  - The patient must first make sure to have activated MyChart and know their login information - If Apple, go to [employer] and type in MyChart in the search bar and download the app. If Android, ask patient to go to [employer]  and type in [place] in the search bar and download the app. The app is free but as with any other app downloads, their phone may require them to verify saved payment information or Apple/Android password.  - The patient will need to then log into the app with their MyChart username and password, and select [facility] as their healthcare provider to link the account. When it is time for your visit, go to the MyChart app, find appointments, and click Begin Video Visit. Be sure to Select Allow for your device to access the Microphone and Camera for your visit. You will then be connected, and your provider will be with you shortly.  **If they have any issues connecting, or need assistance please contact MyChart service desk [phone])**  **If using a computer, in order to ensure the best quality for their visit they will need to use either of the following Internet Browsers: [employer], or Google Chrome**  IF USING DOXIMITY or DOXY.ME - The patient will receive a link just prior to their visit by text.     FULL LENGTH CONSENT FOR TELE-HEALTH VISIT   I hereby voluntarily request, consent and authorize [facility] and its employed or contracted physicians, physician assistants, nurse practitioners or other licensed health care professionals (the Practitioner), to provide me with telemedicine health care services (the "Services") as deemed necessary by the treating Practitioner. I acknowledge and consent to receive the Services by the Practitioner via telemedicine. I understand that the telemedicine visit will involve communicating with the Practitioner through live audiovisual communication technology and the disclosure of certain medical information by electronic transmission. I acknowledge that I have been given the opportunity to request an in-person assessment or other available alternative prior to the telemedicine visit and am voluntarily participating in the telemedicine  visit.  I understand that I have the right to withhold or withdraw my consent to the use of telemedicine in the course of my care at any time, without affecting my right to future care or treatment, and that the Practitioner or I may terminate the telemedicine visit at any time. I understand that I have the right to inspect all information obtained and/or recorded in the course of the telemedicine visit and may receive copies of available information for a reasonable fee.  I understand that some of the potential risks of receiving the Services via telemedicine include:  Marland Kitchen Delay or interruption in medical evaluation due to technological equipment failure or disruption; . Information transmitted may not be sufficient (e.g. poor resolution of images) to allow for appropriate medical decision making by the Practitioner; and/or  . In rare instances, security protocols could fail, causing a breach of personal health information.  Furthermore, I acknowledge that it is my responsibility to provide information about my medical history, conditions and care that is complete and accurate to the best of my ability. I acknowledge that Practitioner's advice, recommendations,  and/or decision may be based on factors not within their control, such as incomplete or inaccurate data provided by me or distortions of diagnostic images or specimens that may result from electronic transmissions. I understand that the practice of medicine is not an exact science and that Practitioner makes no warranties or guarantees regarding treatment outcomes. I acknowledge that I will receive a copy of this consent concurrently upon execution via email to the email address I last provided but may also request a printed copy by calling the office of [facility].    I understand that my insurance will be billed for this visit.   I have read or had this consent read to me. . I understand the contents of this consent, which adequately explains  the benefits and risks of the Services being provided via telemedicine.  . I have been provided ample opportunity to ask questions regarding this consent and the Services and have had my questions answered to my satisfaction. . I give my informed consent for the services to be provided through the use of telemedicine in my medical care  By participating in this telemedicine visit I agree to the above.

## 2018-06-24 NOTE — Telephone Encounter (Signed)
I spoke to the patient who I asked to call this afternoon and update [place] on BP and HR.  At 2:00 pm his BP was 123/77 and HR 99.    Now earlier today at 11:00 while shaving, his HR jumped up to 159, but then immediately decreased to the 90s.    He has had no dizziness nor any other symptoms since this morning.

## 2018-06-24 NOTE — Telephone Encounter (Signed)
I spoke to the patient who called to inform us that he woke up this morning with some dizziness.  He checked his BP and it was 109/66 HR 75.  He checked many Bps over a short period of time, but I told him that they could be inaccurate, if checked too frequently.    He is otherwise asymptomatic and felt good while on the phone.  He was just concerned because of his hx of Afib, but HR has not exceeded 91 bpm, even with walking.  I told him to recheck later today and give [place] a call with results.  He has a recall appt in November with Dr Curt Bears, but was wondering if he may need to be seen sooner, if dizziness persists.

## 2018-06-24 NOTE — Telephone Encounter (Signed)
Follow up    Pt is returning call with BP readings  123/77 hr 99 resting    Please call back

## 2018-06-24 NOTE — Telephone Encounter (Signed)
New message   Patient c/o Palpitations:  High priority if patient c/o lightheadedness, shortness of breath, or chest pain  1) How long have you had palpitations/irregular HR/ Afib? Are you having the symptoms now? Patient states that started at 4:00am this morning, yes   2) Are you currently experiencing lightheadedness, SOB or CP? lightheadedness  3) Do you have a history of afib (atrial fibrillation) or irregular heart rhythm? Yes   4) Have you checked your BP or HR? (document readings if available):109/66 b/p 115/71 b/p  5) Are you experiencing any other symptoms? No

## 2018-06-26 ENCOUNTER — Telehealth (INDEPENDENT_AMBULATORY_CARE_PROVIDER_SITE_OTHER): Payer: Medicare Other | Admitting: Cardiology

## 2018-06-26 ENCOUNTER — Other Ambulatory Visit: Payer: Self-pay

## 2018-06-26 DIAGNOSIS — I480 Paroxysmal atrial fibrillation: Secondary | ICD-10-CM | POA: Diagnosis not present

## 2018-06-26 MED ORDER — DILTIAZEM HCL 30 MG PO TABS: 30.0000 mg | ORAL_TABLET | Freq: Four times a day (QID) | ORAL | 3 refills | Status: DC | PRN

## 2018-06-26 NOTE — Progress Notes (Signed)
Electrophysiology TeleHealth Note   Due to national recommendations of social distancing due to COVID 19, an audio/video telehealth visit is felt to be most appropriate for this patient at this time.  See Epic message for the patient's consent to telehealth for [facility].   Date:  06/26/2018   ID:  Justin Coffey, DOB December 13, 1945, MRN [id number]  Location: patient's home  Provider location: [address], [place]  Evaluation Performed: Follow-up visit  PCP:  Sharilyn Sites, MD  Cardiologist:  No primary care provider on file.  Electrophysiologist:  Dr Curt Bears  Chief Complaint:  Atrial fibrillation  History of Present Illness:    Justin Coffey is a 73 y.o. male who presents via audio/video conferencing for a telehealth visit today.  Since last being seen in our clinic, the patient reports doing very well.  Today, he denies symptoms of palpitations, chest pain, shortness of breath,  lower extremity edema, dizziness, presyncope, or syncope.  The patient is otherwise without complaint today.  The patient denies symptoms of fevers, chills, cough, or new SOB worrisome for COVID 19.  He has a history of hypertension, atrial fibrillation, and situs inversus.  Today, denies symptoms of palpitations, chest pain, shortness of breath, orthopnea, PND, lower extremity edema, claudication, dizziness, presyncope, syncope, bleeding, or neurologic sequela. The patient is tolerating medications without difficulties.  Overall he is doing well.  He did have an episode of atrial fibrillation last week.  He went out of rhythm at 4 in the morning and stayed in atrial fibrillation until 7 in the evening.  He converted to sinus rhythm after taking his diltiazem.  At rest his heart rates were in the 80s to 90s, but when he was up and exerting himself his heart rates got into the 160s.  This was his first episode of atrial fibrillation since his diagnosis.  Past Medical History:  Diagnosis  Date  . Atrial fibrillation ([place])   . Colon polyps   . Dysrhythmia    atrial fib  . Esophageal ring 03/16/08   s/p esophageal dilation Savary 57mm Dr [name]   . GERD (gastroesophageal reflux disease)    hx  . HTN (hypertension)   . Hyperlipemia   . Situs inversus   . Tubular adenoma 12/30/2007   removed from sigmoid colon    Past Surgical History:  Procedure Laterality Date  . COLONOSCOPY N/A 08/08/2015   Procedure: COLONOSCOPY;  Surgeon: Danie Binder, MD;  [facility];  Service: Endoscopy;  Laterality: N/A;  830  . ESOPHAGOGASTRODUODENOSCOPY N/A 08/08/2015   Procedure: ESOPHAGOGASTRODUODENOSCOPY (EGD);  Surgeon: Danie Binder, MD;  [facility];  Service: Endoscopy;  Laterality: N/A;  . EXTERNAL EAR SURGERY Right    skin graft - eardrum  . RESECTION OF MEDIASTINAL MASS Left 01/08/2017   Procedure: RESECTION OF LEFT SUPRACLVICULAR MASS - Lipoma - 9cm;  Surgeon: [name], MD;  Location: [facility];  Service: Thoracic;  Laterality: Left;  . TONSILLECTOMY    . TRIGGER FINGER RELEASE Left 01/14/2013   Procedure: RELEASE A-1 PULLEY LEFT THUMB,MIDDLE AND RING FINGER;  Surgeon: Wynonia Sours, MD;  Location: [facility];  Service: Orthopedics;  Laterality: Left;    Current Outpatient Medications  Medication Sig Dispense Refill  . atorvastatin (LIPITOR) 20 MG tablet Take 20 mg by mouth daily.    . Cholecalciferol (VITAMIN D3) 5000 units TABS Take 5,000 Units daily by mouth.     . diltiazem (  CARDIZEM CD) 180 MG 24 hr capsule Take 1 capsule (180 mg total) by mouth daily. 90 capsule 3  . ELIQUIS 5 MG TABS tablet TAKE 1 TABLET TWICE A DAY 180 tablet 1  . losartan (COZAAR) 100 MG tablet Take 100 mg by mouth daily.    . Multiple Vitamin (MULTIVITAMIN) tablet Take 1 tablet by mouth daily.    . Saw Palmetto, Serenoa repens, (SAW PALMETTO PO) Take 272 mg daily by mouth.     No current facility-administered medications for this visit.     Allergies:    Patient has no known allergies.   Social History:  The patient  reports that he has never smoked. He has never used smokeless tobacco. He reports that he does not drink alcohol or use drugs.   Family History:  The patient's  family history includes Healthy in his sister and sister; Lung cancer in his father and mother.   ROS:  Please see the history of present illness.   All other systems are personally reviewed and negative.    Exam:    Vital Signs:  BP 131/71   Pulse 69   Well appearing, alert and conversant, regular work of breathing,  good skin color Eyes- anicteric, neuro- grossly intact, skin- no apparent rash or lesions or cyanosis, mouth- oral mucosa is pink   Labs/Other Tests and Data Reviewed:    Recent Labs: No results found for requested labs within last 8760 hours.   Wt Readings from Last 3 Encounters:  12/30/17 221 lb (100.2 kg)  01/24/17 206 lb (93.4 kg)  01/07/17 206 lb 12.8 oz (93.8 kg)     Other studies personally reviewed: Additional studies/ records that were reviewed today include: ECG 12/30/2017 personally reviewed Review of the above records today demonstrates: Sinus rhythm, right superior axis   ASSESSMENT & PLAN:    1.  Paroxysmal atrial fibrillation: Currently on Eliquis and diltiazem.  Unfortunately he had an episode of atrial fibrillation that lasted approximately 18 hours.  His heart rate was well controlled with rest but did get into the 160s with exertion.  We Shifra Swartzentruber thus call him and diltiazem to take as needed 30 mg.  This patients CHA2DS2-VASc Score and unadjusted Ischemic Stroke Rate (% per year) is equal to 2.2 % stroke rate/year from a score of 2  Above score calculated as 1 point each if present [CHF, HTN, DM, Vascular=MI/PAD/Aortic Plaque, Age if 65-74, or Male] Above score calculated as 2 points each if present [Age > 75, or Stroke/TIA/TE]    2.  Hypertension: Currently well controlled  3.  Hyperlipidemia: Continue statin per  primary care.  COVID 19 screen The patient denies symptoms of COVID 19 at this time.  The importance of social distancing was discussed today.  Follow-up: 6 months  Current medicines are reviewed at length with the patient today.   The patient does not have concerns regarding his medicines.  The following changes were made today: Diltiazem 30 mg as needed for atrial fibrillation  Labs/ tests ordered today include:  No orders of the defined types were placed in this encounter.    Patient Risk:  after full review of this patients clinical status, I feel that they are at moderate risk at this time.  Today, I have spent 12 minutes with the patient with telehealth technology discussing atrial fibrillation.    Signed, Jeaneen Cala Meredith Leeds, MD  06/26/2018 9:30 AM     [place]  [address] [phone] (office) [phone] (fax)

## 2018-06-26 NOTE — Addendum Note (Signed)
Addended by: Stanton Kidney on: 06/26/2018 09:40 AM   Modules accepted: Orders

## 2018-07-08 DIAGNOSIS — D229 Melanocytic nevi, unspecified: Secondary | ICD-10-CM | POA: Diagnosis not present

## 2018-07-16 DIAGNOSIS — E041 Nontoxic single thyroid nodule: Secondary | ICD-10-CM | POA: Diagnosis not present

## 2018-07-16 DIAGNOSIS — D170 Benign lipomatous neoplasm of skin and subcutaneous tissue of head, face and neck: Secondary | ICD-10-CM | POA: Diagnosis not present

## 2018-07-18 DIAGNOSIS — E041 Nontoxic single thyroid nodule: Secondary | ICD-10-CM | POA: Diagnosis not present

## 2018-08-10 ENCOUNTER — Other Ambulatory Visit: Payer: Self-pay | Admitting: Cardiology

## 2018-08-18 DIAGNOSIS — Z6829 Body mass index (BMI) 29.0-29.9, adult: Secondary | ICD-10-CM | POA: Diagnosis not present

## 2018-08-18 DIAGNOSIS — Z125 Encounter for screening for malignant neoplasm of prostate: Secondary | ICD-10-CM | POA: Diagnosis not present

## 2018-08-18 DIAGNOSIS — I4891 Unspecified atrial fibrillation: Secondary | ICD-10-CM | POA: Diagnosis not present

## 2018-08-18 DIAGNOSIS — R7309 Other abnormal glucose: Secondary | ICD-10-CM | POA: Diagnosis not present

## 2018-08-18 DIAGNOSIS — Z23 Encounter for immunization: Secondary | ICD-10-CM | POA: Diagnosis not present

## 2018-08-18 DIAGNOSIS — Z0001 Encounter for general adult medical examination with abnormal findings: Secondary | ICD-10-CM | POA: Diagnosis not present

## 2018-08-18 DIAGNOSIS — R7989 Other specified abnormal findings of blood chemistry: Secondary | ICD-10-CM | POA: Diagnosis not present

## 2018-08-18 DIAGNOSIS — Q893 Situs inversus: Secondary | ICD-10-CM | POA: Diagnosis not present

## 2018-08-18 DIAGNOSIS — E782 Mixed hyperlipidemia: Secondary | ICD-10-CM | POA: Diagnosis not present

## 2018-08-18 DIAGNOSIS — E663 Overweight: Secondary | ICD-10-CM | POA: Diagnosis not present

## 2018-08-18 DIAGNOSIS — Z1389 Encounter for screening for other disorder: Secondary | ICD-10-CM | POA: Diagnosis not present

## 2018-08-18 DIAGNOSIS — I10 Essential (primary) hypertension: Secondary | ICD-10-CM | POA: Diagnosis not present

## 2018-08-18 DIAGNOSIS — Z79899 Other long term (current) drug therapy: Secondary | ICD-10-CM | POA: Diagnosis not present

## 2018-10-15 DIAGNOSIS — Z23 Encounter for immunization: Secondary | ICD-10-CM | POA: Diagnosis not present

## 2018-11-12 ENCOUNTER — Other Ambulatory Visit: Payer: Self-pay | Admitting: Cardiology

## 2018-11-12 NOTE — Telephone Encounter (Signed)
Pt last saw Dr Curt Bears 06/26/18 telemedicine Covid-19, last labs 08/18/18 Creat 1.04 at [facility] per [place], age 73, weight 100.2kg, based on specified criteria pt is on appropriate dosage of Eliquis 5mg  BID.  Will refill rx.

## 2018-12-25 ENCOUNTER — Other Ambulatory Visit: Payer: Self-pay

## 2018-12-25 ENCOUNTER — Encounter: Payer: Self-pay | Admitting: Cardiology

## 2018-12-25 ENCOUNTER — Ambulatory Visit (INDEPENDENT_AMBULATORY_CARE_PROVIDER_SITE_OTHER): Payer: Medicare Other | Admitting: Cardiology

## 2018-12-25 VITALS — BP 136/66 | HR 59 | Ht 71.0 in | Wt 220.0 lb

## 2018-12-25 DIAGNOSIS — I480 Paroxysmal atrial fibrillation: Secondary | ICD-10-CM | POA: Diagnosis not present

## 2018-12-25 NOTE — Progress Notes (Signed)
Electrophysiology Office Note   Date:  12/25/2018   ID:  Justin Coffey, DOB [birth year], MRN [id number]  PCP:  Sharilyn Sites, MD  Cardiologist:  [name] Primary Electrophysiologist:  Constance Haw, MD    No chief complaint on file.    History of Present Illness: Justin Coffey is a 73 y.o. male who presents today for electrophysiology evaluation.   He has a history of hypertension, atrial fibrillation, and situs inversus. He had the sudden onset of palpitations while camping mid-September. He felt that his heart was jumping around. His symptoms woken from sleep. He drove himself to the emergency room but converted to sinus rhythm without need for cardioversion. He has had atrial fibrillation in the past when he was in his 87s in the setting of an electrocution.   Today, denies symptoms of palpitations, chest pain, shortness of breath, orthopnea, PND, lower extremity edema, claudication, dizziness, presyncope, syncope, bleeding, or neurologic sequela. The patient is tolerating medications without difficulties.  Overall he is doing well.  He is noted no further episodes of atrial fibrillation.   Past Medical History:  Diagnosis Date  . Atrial fibrillation ([place])   . Colon polyps   . Dysrhythmia    atrial fib  . Esophageal ring 03/16/08   s/p esophageal dilation Savary 91mm Dr [name]   . GERD (gastroesophageal reflux disease)    hx  . HTN (hypertension)   . Hyperlipemia   . Situs inversus   . Tubular adenoma 12/30/2007   removed from sigmoid colon   Past Surgical History:  Procedure Laterality Date  . COLONOSCOPY N/A 08/08/2015   Procedure: COLONOSCOPY;  Surgeon: Justin Binder, MD;  [facility];  Service: Endoscopy;  Laterality: N/A;  830  . ESOPHAGOGASTRODUODENOSCOPY N/A 08/08/2015   Procedure: ESOPHAGOGASTRODUODENOSCOPY (EGD);  Surgeon: Justin Binder, MD;  [facility];  Service: Endoscopy;  Laterality: N/A;  . EXTERNAL EAR  SURGERY Right    skin graft - eardrum  . RESECTION OF MEDIASTINAL MASS Left 01/08/2017   Procedure: RESECTION OF LEFT SUPRACLVICULAR MASS - Lipoma - 9cm;  Surgeon: Justin Isaac, MD;  Location: [place];  Service: Thoracic;  Laterality: Left;  . TONSILLECTOMY    . TRIGGER FINGER RELEASE Left 01/14/2013   Procedure: RELEASE A-1 PULLEY LEFT THUMB,MIDDLE AND RING FINGER;  Surgeon: Wynonia Sours, MD;  Location: [facility];  Service: Orthopedics;  Laterality: Left;     Current Outpatient Medications  Medication Sig Dispense Refill  . atorvastatin (LIPITOR) 20 MG tablet Take 20 mg by mouth daily.    . Cholecalciferol (VITAMIN D3) 5000 units TABS Take 5,000 Units daily by mouth.     . diltiazem (CARDIZEM CD) 180 MG 24 hr capsule TAKE 1 CAPSULE DAILY 90 capsule 3  . diltiazem (CARDIZEM) 30 MG tablet Take 1 tablet (30 mg total) by mouth 4 (four) times daily as needed. 30 tablet 3  . ELIQUIS 5 MG TABS tablet TAKE 1 TABLET TWICE A DAY 180 tablet 1  . losartan (COZAAR) 100 MG tablet Take 100 mg by mouth daily.    . Multiple Vitamin (MULTIVITAMIN) tablet Take 1 tablet by mouth daily.    . Saw Palmetto, Serenoa repens, (SAW PALMETTO PO) Take 272 mg daily by mouth.    . zinc gluconate 50 MG tablet Take 50 mg by mouth daily.     No current facility-administered medications for this visit.     Allergies:   Patient has  no known allergies.   Social History:  The patient  reports that he has never smoked. He has never used smokeless tobacco. He reports that he does not drink alcohol or use drugs.   Family History:  The patient's family history includes Healthy in his sister and sister; Lung cancer in his father and mother.    ROS:  Please see the history of present illness.   Otherwise, review of systems is positive for none.   All other systems are reviewed and negative.   PHYSICAL EXAM: VS:  BP 136/66   Pulse (!) 59   Ht 5\' 11"  (1.803 m)   Wt 220 lb (99.8 kg)   SpO2 95%   BMI  30.68 kg/m  , BMI Body mass index is 30.68 kg/m. GEN: Well nourished, well developed, in no acute distress  HEENT: normal  Neck: no JVD, carotid bruits, or masses Cardiac: RRR; no murmurs, rubs, or gallops,no edema  Respiratory:  clear to auscultation bilaterally, normal work of breathing GI: soft, nontender, nondistended, + BS MS: no deformity or atrophy  Skin: warm and dry Neuro:  Strength and sensation are intact Psych: euthymic mood, full affect  EKG:  EKG is ordered today. Personal review of the ekg ordered shows sinus rhythm, rate 59, voltage criteria minimal for LVH  Recent Labs: No results found for requested labs within last 8760 hours.    Lipid Panel  No results found for: CHOL, TRIG, HDL, CHOLHDL, VLDL, LDLCALC, LDLDIRECT   Wt Readings from Last 3 Encounters:  12/25/18 220 lb (99.8 kg)  12/30/17 221 lb (100.2 kg)  01/24/17 206 lb (93.4 kg)      Other studies Reviewed: Additional studies/ records that were reviewed today include: 10/18/15 TTE Review of the above records today demonstrates:  The left ventricular size is normal. Basal left ventricular septal hypertrophy  Left ventricular systolic function is mildly reduced.  Left ventricular filling pattern is impaired. No segmental wall motion abnormalities seen in the left ventricle The right ventricular systolic function is normal. There is no significant valvular stenosis or regurgitation There is no pericardial effusion. The left atrial size is normal.  ASSESSMENT AND PLAN:  1.  Paroxysmal atrial fibrillation: Currently on Eliquis and diltiazem.  He has had no further episodes of atrial fibrillation.  No changes.  This patients CHA2DS2-VASc Score and unadjusted Ischemic Stroke Rate (% per year) is equal to 2.2 % stroke rate/year from a score of 2  Above score calculated as 1 point each if present [CHF, HTN, DM, Vascular=MI/PAD/Aortic Plaque, Age if 65-74, or Male] Above score calculated as 2 points  each if present [Age > 75, or Stroke/TIA/TE]   2. Hypertension: Mildly elevated but is usually much better controlled.  No changes.  3. Hyperlipidemia: Cholesterol currently well controlled on labs that he brought in today.  Continue atorvastatin.   Current medicines are reviewed at length with the patient today.   The patient does not have concerns regarding his medicines.  The following changes were made today: None  Labs/ tests ordered today include:  No orders of the defined types were placed in this encounter.    Disposition:   FU with Dayami Taitt 1 year  Signed, Pat Elicker Meredith Leeds, MD  12/25/2018 3:19 PM     [facility] [address] [place] [address] [phone] (office) [phone] (fax)

## 2018-12-25 NOTE — Patient Instructions (Signed)
Medication Instructions:  Your physician recommends that you continue on your current medications as directed. Please refer to the Current Medication list given to you today.  * If you need a refill on your cardiac medications before your next appointment, please call your pharmacy.   Labwork: None ordered  Testing/Procedures: None ordered  Follow-Up: At [facility], you and your health needs are our priority.  As part of our continuing mission to provide you with exceptional heart care, we have created designated Provider Care Teams.  These Care Teams include your primary Cardiologist (physician) and Advanced Practice Providers (APPs -  Physician Assistants and Nurse Practitioners) who all work together to provide you with the care you need, when you need it.  You will need a follow up appointment in 1 year.  Please call our office 2 months in advance to schedule this appointment.  You may see Dr Curt Bears or one of the following Advanced Practice Providers on your designated Care Team:    Chanetta Marshall, NP  Tommye Standard, PA-C  Oda Kilts, [place]  Thank you for choosing [facility]!!   Trinidad Curet, RN [phone]

## 2018-12-26 NOTE — Addendum Note (Signed)
Addended by: Rose Phi on: 12/26/2018 03:31 PM   Modules accepted: Orders

## 2019-01-07 ENCOUNTER — Other Ambulatory Visit: Payer: Self-pay

## 2019-01-12 DIAGNOSIS — I480 Paroxysmal atrial fibrillation: Secondary | ICD-10-CM | POA: Diagnosis not present

## 2019-01-12 DIAGNOSIS — I10 Essential (primary) hypertension: Secondary | ICD-10-CM | POA: Diagnosis not present

## 2019-01-12 DIAGNOSIS — E7849 Other hyperlipidemia: Secondary | ICD-10-CM | POA: Diagnosis not present

## 2019-03-23 DIAGNOSIS — M4603 Spinal enthesopathy, cervicothoracic region: Secondary | ICD-10-CM | POA: Diagnosis not present

## 2019-03-23 DIAGNOSIS — M542 Cervicalgia: Secondary | ICD-10-CM | POA: Diagnosis not present

## 2019-03-23 DIAGNOSIS — Z7901 Long term (current) use of anticoagulants: Secondary | ICD-10-CM | POA: Diagnosis not present

## 2019-03-31 DIAGNOSIS — M4603 Spinal enthesopathy, cervicothoracic region: Secondary | ICD-10-CM | POA: Diagnosis not present

## 2019-03-31 DIAGNOSIS — M542 Cervicalgia: Secondary | ICD-10-CM | POA: Diagnosis not present

## 2019-04-13 ENCOUNTER — Telehealth: Payer: Self-pay | Admitting: Cardiology

## 2019-04-13 DIAGNOSIS — Z23 Encounter for immunization: Secondary | ICD-10-CM | POA: Diagnosis not present

## 2019-04-13 DIAGNOSIS — I10 Essential (primary) hypertension: Secondary | ICD-10-CM | POA: Diagnosis not present

## 2019-04-13 DIAGNOSIS — Z7689 Persons encountering health services in other specified circumstances: Secondary | ICD-10-CM | POA: Diagnosis not present

## 2019-04-13 NOTE — Telephone Encounter (Signed)
Pt aware to get either vaccine available. Pt scheduled to get his first today, he is getting the Moderna covid vaccine. Pt appreciates the return call.

## 2019-04-13 NOTE — Telephone Encounter (Signed)
  Patient is calling because he would like to know if there is a vaccine he should not get due to his Afib. He had heard that the [place] vaccine did not do good for patients with Afib but he wasn't sure if this was fact or fiction. Please advise.

## 2019-05-05 ENCOUNTER — Other Ambulatory Visit: Payer: Self-pay | Admitting: Cardiology

## 2019-05-05 NOTE — Telephone Encounter (Signed)
Prescription refill request for Eliquis received.  Last office visit: 12/25/2018, [place] Scr: 1.04 08/18/2018 Age: 74 y.o. Weight: 99.8 kg   Prescription refill sent.

## 2019-05-11 DIAGNOSIS — Z7689 Persons encountering health services in other specified circumstances: Secondary | ICD-10-CM | POA: Diagnosis not present

## 2019-05-11 DIAGNOSIS — I10 Essential (primary) hypertension: Secondary | ICD-10-CM | POA: Diagnosis not present

## 2019-05-11 DIAGNOSIS — Z23 Encounter for immunization: Secondary | ICD-10-CM | POA: Diagnosis not present

## 2019-06-12 DIAGNOSIS — I480 Paroxysmal atrial fibrillation: Secondary | ICD-10-CM | POA: Diagnosis not present

## 2019-06-12 DIAGNOSIS — E7849 Other hyperlipidemia: Secondary | ICD-10-CM | POA: Diagnosis not present

## 2019-06-12 DIAGNOSIS — I10 Essential (primary) hypertension: Secondary | ICD-10-CM | POA: Diagnosis not present

## 2019-07-01 DIAGNOSIS — H2513 Age-related nuclear cataract, bilateral: Secondary | ICD-10-CM | POA: Diagnosis not present

## 2019-07-01 DIAGNOSIS — H35379 Puckering of macula, unspecified eye: Secondary | ICD-10-CM | POA: Diagnosis not present

## 2019-07-01 DIAGNOSIS — H43393 Other vitreous opacities, bilateral: Secondary | ICD-10-CM | POA: Diagnosis not present

## 2019-07-01 DIAGNOSIS — H40013 Open angle with borderline findings, low risk, bilateral: Secondary | ICD-10-CM | POA: Diagnosis not present

## 2019-07-01 DIAGNOSIS — H259 Unspecified age-related cataract: Secondary | ICD-10-CM | POA: Diagnosis not present

## 2019-07-27 ENCOUNTER — Other Ambulatory Visit: Payer: Self-pay | Admitting: Cardiology

## 2019-07-28 ENCOUNTER — Other Ambulatory Visit: Payer: Self-pay

## 2019-07-28 ENCOUNTER — Ambulatory Visit (INDEPENDENT_AMBULATORY_CARE_PROVIDER_SITE_OTHER): Payer: Medicare Other | Admitting: Dermatology

## 2019-07-28 ENCOUNTER — Encounter: Payer: Self-pay | Admitting: Dermatology

## 2019-07-28 DIAGNOSIS — D1801 Hemangioma of skin and subcutaneous tissue: Secondary | ICD-10-CM | POA: Diagnosis not present

## 2019-07-28 DIAGNOSIS — D225 Melanocytic nevi of trunk: Secondary | ICD-10-CM | POA: Diagnosis not present

## 2019-07-28 DIAGNOSIS — Z1283 Encounter for screening for malignant neoplasm of skin: Secondary | ICD-10-CM | POA: Diagnosis not present

## 2019-07-28 DIAGNOSIS — L570 Actinic keratosis: Secondary | ICD-10-CM | POA: Diagnosis not present

## 2019-07-28 DIAGNOSIS — L821 Other seborrheic keratosis: Secondary | ICD-10-CM

## 2019-07-28 DIAGNOSIS — D229 Melanocytic nevi, unspecified: Secondary | ICD-10-CM

## 2019-07-28 NOTE — Progress Notes (Signed)
   Follow-Up Visit   Subjective  Justin Coffey is a 74 y.o. male who presents for the following: Annual Exam (no new concerns- dry spots on ears per wife).  Crusts Location: Ears Duration:  Quality:  Associated Signs/Symptoms: Modifying Factors:  Severity:  Timing: Context:   The following portions of the chart were reviewed this encounter and updated as appropriate: Tobacco  Allergies  Meds  Problems  Med Hx  Surg Hx  Fam Hx      Objective  Well appearing patient in no apparent distress; mood and affect are within normal limits.  All skin feet to scalp examined today.   Assessment & Plan  AK (actinic keratosis) (2) Right Scaphoid Fossa; Left Antihelix  Does not currently require freezing or biopsy.  Nevus Mid Back  Annual skin exam  Seborrheic keratosis (2) Left Postauricular Area; Right Forehead  Leave if stable Feet to scalp skin examination on Justin Coffey date of birth 14-Nov-1945.  There are currently no atypical moles, melanoma, or nonmelanoma skin cancer. the rough spot on the back of the right middle ear is clinically a benign keratosis and can be left unless it changes.  Similar smaller lesions on the front of the lower neck and the lower abdomen.  A little skin tag on the left side of the neck may be a tiny wart and if this grows Amer can get an over-the-counter wart freeze from Compound W or Verruca-Freeze and treated at home.  The little red dots are benign angiomas.  He has some dilated veins on his legs which are causing him no problem.  Otherwise his skin from top to bottom is in excellent shape.  He does spend most of the year in [place] and understands the need for ultraviolet protection. Skin cancer screening performed today.

## 2019-08-29 ENCOUNTER — Encounter: Payer: Self-pay | Admitting: Dermatology

## 2019-09-09 ENCOUNTER — Other Ambulatory Visit: Payer: Self-pay | Admitting: *Deleted

## 2019-09-11 DIAGNOSIS — E7849 Other hyperlipidemia: Secondary | ICD-10-CM | POA: Diagnosis not present

## 2019-09-11 DIAGNOSIS — I480 Paroxysmal atrial fibrillation: Secondary | ICD-10-CM | POA: Diagnosis not present

## 2019-09-11 DIAGNOSIS — I10 Essential (primary) hypertension: Secondary | ICD-10-CM | POA: Diagnosis not present

## 2019-09-21 DIAGNOSIS — R7309 Other abnormal glucose: Secondary | ICD-10-CM | POA: Diagnosis not present

## 2019-09-21 DIAGNOSIS — Z6829 Body mass index (BMI) 29.0-29.9, adult: Secondary | ICD-10-CM | POA: Diagnosis not present

## 2019-09-21 DIAGNOSIS — E7849 Other hyperlipidemia: Secondary | ICD-10-CM | POA: Diagnosis not present

## 2019-09-21 DIAGNOSIS — Z0001 Encounter for general adult medical examination with abnormal findings: Secondary | ICD-10-CM | POA: Diagnosis not present

## 2019-09-21 DIAGNOSIS — I4891 Unspecified atrial fibrillation: Secondary | ICD-10-CM | POA: Diagnosis not present

## 2019-09-21 DIAGNOSIS — I480 Paroxysmal atrial fibrillation: Secondary | ICD-10-CM | POA: Diagnosis not present

## 2019-09-21 DIAGNOSIS — I10 Essential (primary) hypertension: Secondary | ICD-10-CM | POA: Diagnosis not present

## 2019-09-21 DIAGNOSIS — Z125 Encounter for screening for malignant neoplasm of prostate: Secondary | ICD-10-CM | POA: Diagnosis not present

## 2019-09-21 DIAGNOSIS — Z1389 Encounter for screening for other disorder: Secondary | ICD-10-CM | POA: Diagnosis not present

## 2019-09-21 DIAGNOSIS — R7989 Other specified abnormal findings of blood chemistry: Secondary | ICD-10-CM | POA: Diagnosis not present

## 2019-10-25 ENCOUNTER — Other Ambulatory Visit: Payer: Self-pay | Admitting: Cardiology

## 2019-10-26 NOTE — Telephone Encounter (Signed)
Pt last saw Dr Curt Bears on 12/25/18, last labs 09/21/19 Creat 1.09 at [facility] per [place], age 74, weight 99.8kg, based on specified criteria pt is on appropriate dosage of Eliquis 5mg  BID.  Will refill rx.

## 2019-11-12 DIAGNOSIS — I10 Essential (primary) hypertension: Secondary | ICD-10-CM | POA: Diagnosis not present

## 2019-11-12 DIAGNOSIS — E7849 Other hyperlipidemia: Secondary | ICD-10-CM | POA: Diagnosis not present

## 2019-11-12 DIAGNOSIS — I480 Paroxysmal atrial fibrillation: Secondary | ICD-10-CM | POA: Diagnosis not present

## 2019-11-27 DIAGNOSIS — H02889 Meibomian gland dysfunction of unspecified eye, unspecified eyelid: Secondary | ICD-10-CM | POA: Diagnosis not present

## 2019-11-27 DIAGNOSIS — H1013 Acute atopic conjunctivitis, bilateral: Secondary | ICD-10-CM | POA: Diagnosis not present

## 2019-12-29 ENCOUNTER — Ambulatory Visit (INDEPENDENT_AMBULATORY_CARE_PROVIDER_SITE_OTHER): Payer: Medicare Other | Admitting: Cardiology

## 2019-12-29 ENCOUNTER — Other Ambulatory Visit: Payer: Self-pay

## 2019-12-29 ENCOUNTER — Encounter: Payer: Self-pay | Admitting: Cardiology

## 2019-12-29 VITALS — BP 128/78 | HR 72 | Ht 71.0 in | Wt 218.2 lb

## 2019-12-29 DIAGNOSIS — I480 Paroxysmal atrial fibrillation: Secondary | ICD-10-CM

## 2019-12-29 NOTE — Progress Notes (Signed)
Take   Electrophysiology Office Note   Date:  12/29/2019   ID:  Justin Coffey, DOB March 07, 1945, MRN [id number]  PCP:  Sharilyn Sites, MD  Cardiologist:  [name] Primary Electrophysiologist:  Constance Haw, MD    No chief complaint on file.    History of Present Illness: Justin Coffey is a 74 y.o. male who presents today for electrophysiology evaluation.     He has a history significant for hypertension, atrial fibrillation, and situs inversus.  He had sudden onset palpitations while camping mid-September.  His heart was jumping around with symptoms that woke him from sleep.  He drove himself to the emergency room but converted to sinus rhythm without need for cardioversion.  He has had atrial fibrillation in the past when he was electrocuted in his 33s.  Today, denies symptoms of palpitations, chest pain, shortness of breath, orthopnea, PND, lower extremity edema, claudication, dizziness, presyncope, syncope, bleeding, or neurologic sequela. The patient is tolerating medications without difficulties.  Since last being seen he has done well.  He is remained quite active.  He walks between 3 to 5 miles a day.  He also does quite a bit of camping with his significant other.  He is planning on going to [place] for the next 4 months in December, where he continues to exercise with kayaking, walking, and swimming.  He has no complaint and is overall feeling well.   Past Medical History:  Diagnosis Date   Atrial fibrillation [facility])    Colon polyps    Dysrhythmia    atrial fib   Esophageal ring 03/16/08   s/p esophageal dilation Savary 64mm Dr [name]    GERD (gastroesophageal reflux disease)    hx   HTN (hypertension)    Hyperlipemia    Situs inversus    Tubular adenoma 12/30/2007   removed from sigmoid colon   Past Surgical History:  Procedure Laterality Date   COLONOSCOPY N/A 08/08/2015   Procedure: COLONOSCOPY;  Surgeon: Danie Binder, MD;   [facility];  Service: Endoscopy;  Laterality: N/A;  830   ESOPHAGOGASTRODUODENOSCOPY N/A 08/08/2015   Procedure: ESOPHAGOGASTRODUODENOSCOPY (EGD);  Surgeon: Danie Binder, MD;  [facility];  Service: Endoscopy;  Laterality: N/A;   EXTERNAL EAR SURGERY Right    skin graft - eardrum   RESECTION OF MEDIASTINAL MASS Left 01/08/2017   Procedure: RESECTION OF LEFT SUPRACLVICULAR MASS - Lipoma - 9cm;  Surgeon: [name], MD;  Location: [place];  Service: Thoracic;  Laterality: Left;   TONSILLECTOMY     TRIGGER FINGER RELEASE Left 01/14/2013   Procedure: RELEASE A-1 PULLEY LEFT THUMB,MIDDLE AND RING FINGER;  Surgeon: Wynonia Sours, MD;  Location: [place];  Service: Orthopedics;  Laterality: Left;     Current Outpatient Medications  Medication Sig Dispense Refill   atorvastatin (LIPITOR) 20 MG tablet Take 20 mg by mouth daily.     Cholecalciferol (VITAMIN D3) 5000 units TABS Take 5,000 Units daily by mouth.      diltiazem (CARDIZEM CD) 180 MG 24 hr capsule TAKE 1 CAPSULE DAILY 90 capsule 1   diltiazem (CARDIZEM) 30 MG tablet Take 1 tablet (30 mg total) by mouth 4 (four) times daily as needed. 30 tablet 3   ELIQUIS 5 MG TABS tablet TAKE 1 TABLET TWICE A DAY 180 tablet 1   losartan (COZAAR) 100 MG tablet Take 100 mg by mouth daily.     Multiple Vitamin (MULTIVITAMIN) tablet Take  1 tablet by mouth daily.     Omega-3 Fatty Acids (FISH OIL) 1000 MG CAPS Take 1 capsule by mouth daily.     Saw Palmetto, Serenoa repens, (SAW PALMETTO PO) Take 272 mg daily by mouth.     zinc gluconate 50 MG tablet Take 50 mg by mouth daily.     No current facility-administered medications for this visit.    Allergies:   Patient has no known allergies.   Social History:  The patient  reports that he has never smoked. He has never used smokeless tobacco. He reports that he does not drink alcohol and does not use drugs.   Family History:  The patient's family  history includes Healthy in his sister and sister; Lung cancer in his father and mother.    ROS:  Please see the history of present illness.   Otherwise, review of systems is positive for none.   All other systems are reviewed and negative.   PHYSICAL EXAM: VS:  BP 128/78    Pulse 72    Ht 5\' 11"  (1.803 m)    Wt 218 lb 3.2 oz (99 kg)    SpO2 99%    BMI 30.43 kg/m  , BMI Body mass index is 30.43 kg/m. GEN: Well nourished, well developed, in no acute distress  HEENT: normal  Neck: no JVD, carotid bruits, or masses Cardiac: RRR; no murmurs, rubs, or gallops,no edema  Respiratory:  clear to auscultation bilaterally, normal work of breathing GI: soft, nontender, nondistended, + BS MS: no deformity or atrophy  Skin: warm and dry Neuro:  Strength and sensation are intact Psych: euthymic mood, full affect  EKG:  EKG is ordered today. Personal review of the ekg ordered shows sinus rhythm, rate 67  Recent Labs: No results found for requested labs within last 8760 hours.    Lipid Panel  No results found for: CHOL, TRIG, HDL, CHOLHDL, VLDL, LDLCALC, LDLDIRECT   Wt Readings from Last 3 Encounters:  12/29/19 218 lb 3.2 oz (99 kg)  12/25/18 220 lb (99.8 kg)  12/30/17 221 lb (100.2 kg)      Other studies Reviewed: Additional studies/ records that were reviewed today include: 10/18/15 TTE Review of the above records today demonstrates:  The left ventricular size is normal. Basal left ventricular septal hypertrophy  Left ventricular systolic function is mildly reduced.  Left ventricular filling pattern is impaired. No segmental wall motion abnormalities seen in the left ventricle The right ventricular systolic function is normal. There is no significant valvular stenosis or regurgitation There is no pericardial effusion. The left atrial size is normal.  ASSESSMENT AND PLAN:  1.  Paroxysmal atrial fibrillation: Currently on Eliquis and diltiazem.  CHA2DS2-VASc of 2.  He remains in  sinus rhythm and has not had more atrial fibrillation.  No changes.  2.  Hypertension: Currently well controlled  3.  Hyperlipidemia: Continue atorvastatin.  Current medicines are reviewed at length with the patient today.   The patient does not have concerns regarding his medicines.  The following changes were made today: None  Labs/ tests ordered today include:  Orders Placed This Encounter  Procedures   EKG 12-Lead     Disposition:   FU with Makaelyn Aponte 1 year  Signed, Moishy Laday Meredith Leeds, MD  12/29/2019 10:06 AM     [facility] [address] [place] [address] [phone] (office) [phone] (fax)

## 2020-01-08 DIAGNOSIS — Z23 Encounter for immunization: Secondary | ICD-10-CM | POA: Diagnosis not present

## 2020-01-12 DIAGNOSIS — I480 Paroxysmal atrial fibrillation: Secondary | ICD-10-CM | POA: Diagnosis not present

## 2020-01-12 DIAGNOSIS — E7849 Other hyperlipidemia: Secondary | ICD-10-CM | POA: Diagnosis not present

## 2020-01-12 DIAGNOSIS — I10 Essential (primary) hypertension: Secondary | ICD-10-CM | POA: Diagnosis not present

## 2020-01-14 DIAGNOSIS — Z23 Encounter for immunization: Secondary | ICD-10-CM | POA: Diagnosis not present

## 2020-01-23 ENCOUNTER — Other Ambulatory Visit: Payer: Self-pay | Admitting: Cardiology

## 2020-02-12 DIAGNOSIS — I480 Paroxysmal atrial fibrillation: Secondary | ICD-10-CM | POA: Diagnosis not present

## 2020-02-12 DIAGNOSIS — E7849 Other hyperlipidemia: Secondary | ICD-10-CM | POA: Diagnosis not present

## 2020-02-12 DIAGNOSIS — I10 Essential (primary) hypertension: Secondary | ICD-10-CM | POA: Diagnosis not present

## 2020-02-25 DIAGNOSIS — M62838 Other muscle spasm: Secondary | ICD-10-CM | POA: Diagnosis not present

## 2020-02-25 DIAGNOSIS — M542 Cervicalgia: Secondary | ICD-10-CM | POA: Diagnosis not present

## 2020-02-25 DIAGNOSIS — S139XXA Sprain of joints and ligaments of unspecified parts of neck, initial encounter: Secondary | ICD-10-CM | POA: Diagnosis not present

## 2020-03-12 DIAGNOSIS — E782 Mixed hyperlipidemia: Secondary | ICD-10-CM | POA: Diagnosis not present

## 2020-03-12 DIAGNOSIS — K219 Gastro-esophageal reflux disease without esophagitis: Secondary | ICD-10-CM | POA: Diagnosis not present

## 2020-03-12 DIAGNOSIS — I10 Essential (primary) hypertension: Secondary | ICD-10-CM | POA: Diagnosis not present

## 2020-03-17 DIAGNOSIS — R222 Localized swelling, mass and lump, trunk: Secondary | ICD-10-CM | POA: Diagnosis not present

## 2020-03-17 DIAGNOSIS — Z7189 Other specified counseling: Secondary | ICD-10-CM | POA: Diagnosis not present

## 2020-03-28 DIAGNOSIS — R221 Localized swelling, mass and lump, neck: Secondary | ICD-10-CM | POA: Diagnosis not present

## 2020-03-30 DIAGNOSIS — Z7189 Other specified counseling: Secondary | ICD-10-CM | POA: Diagnosis not present

## 2020-03-30 DIAGNOSIS — R590 Localized enlarged lymph nodes: Secondary | ICD-10-CM | POA: Diagnosis not present

## 2020-03-30 DIAGNOSIS — Z712 Person consulting for explanation of examination or test findings: Secondary | ICD-10-CM | POA: Diagnosis not present

## 2020-04-22 ENCOUNTER — Other Ambulatory Visit: Payer: Self-pay

## 2020-04-22 MED ORDER — APIXABAN 5 MG PO TABS: 5.0000 mg | ORAL_TABLET | Freq: Two times a day (BID) | ORAL | 1 refills | Status: DC

## 2020-04-22 NOTE — Telephone Encounter (Signed)
Received faxed refill request for Eliquis from [facility].  Pt last saw Dr Curt Bears 12/29/19, last labs 09/21/19 Creat 1.09 at [place] per [place], age 75, weight 99kg, based on specified criteria pt is on appropriate dosage of Eliquis 5mg  BID for afib.  Will refill rx.

## 2020-05-11 DIAGNOSIS — K219 Gastro-esophageal reflux disease without esophagitis: Secondary | ICD-10-CM | POA: Diagnosis not present

## 2020-05-11 DIAGNOSIS — E7849 Other hyperlipidemia: Secondary | ICD-10-CM | POA: Diagnosis not present

## 2020-05-11 DIAGNOSIS — I10 Essential (primary) hypertension: Secondary | ICD-10-CM | POA: Diagnosis not present

## 2020-06-10 DIAGNOSIS — I480 Paroxysmal atrial fibrillation: Secondary | ICD-10-CM | POA: Diagnosis not present

## 2020-06-10 DIAGNOSIS — D1730 Benign lipomatous neoplasm of skin and subcutaneous tissue of unspecified sites: Secondary | ICD-10-CM | POA: Diagnosis not present

## 2020-06-10 DIAGNOSIS — J40 Bronchitis, not specified as acute or chronic: Secondary | ICD-10-CM | POA: Diagnosis not present

## 2020-06-10 DIAGNOSIS — R498 Other voice and resonance disorders: Secondary | ICD-10-CM | POA: Diagnosis not present

## 2020-06-10 DIAGNOSIS — J042 Acute laryngotracheitis: Secondary | ICD-10-CM | POA: Diagnosis not present

## 2020-06-10 DIAGNOSIS — Z6831 Body mass index (BMI) 31.0-31.9, adult: Secondary | ICD-10-CM | POA: Diagnosis not present

## 2020-06-11 DIAGNOSIS — I10 Essential (primary) hypertension: Secondary | ICD-10-CM | POA: Diagnosis not present

## 2020-06-11 DIAGNOSIS — E7849 Other hyperlipidemia: Secondary | ICD-10-CM | POA: Diagnosis not present

## 2020-06-11 DIAGNOSIS — K219 Gastro-esophageal reflux disease without esophagitis: Secondary | ICD-10-CM | POA: Diagnosis not present

## 2020-07-12 DIAGNOSIS — K219 Gastro-esophageal reflux disease without esophagitis: Secondary | ICD-10-CM | POA: Diagnosis not present

## 2020-07-12 DIAGNOSIS — I10 Essential (primary) hypertension: Secondary | ICD-10-CM | POA: Diagnosis not present

## 2020-07-12 DIAGNOSIS — E782 Mixed hyperlipidemia: Secondary | ICD-10-CM | POA: Diagnosis not present

## 2020-07-27 ENCOUNTER — Ambulatory Visit: Payer: Medicare Other | Admitting: Dermatology

## 2020-08-02 DIAGNOSIS — U071 COVID-19: Secondary | ICD-10-CM | POA: Diagnosis not present

## 2020-08-07 DIAGNOSIS — U071 COVID-19: Secondary | ICD-10-CM | POA: Diagnosis not present

## 2020-08-09 ENCOUNTER — Encounter: Payer: Self-pay | Admitting: Internal Medicine

## 2020-08-17 ENCOUNTER — Encounter: Payer: Self-pay | Admitting: Dermatology

## 2020-08-17 ENCOUNTER — Ambulatory Visit (INDEPENDENT_AMBULATORY_CARE_PROVIDER_SITE_OTHER): Payer: Medicare Other | Admitting: Dermatology

## 2020-08-17 ENCOUNTER — Other Ambulatory Visit: Payer: Self-pay

## 2020-08-17 DIAGNOSIS — Z1283 Encounter for screening for malignant neoplasm of skin: Secondary | ICD-10-CM

## 2020-08-17 DIAGNOSIS — L851 Acquired keratosis [keratoderma] palmaris et plantaris: Secondary | ICD-10-CM

## 2020-08-17 DIAGNOSIS — L729 Follicular cyst of the skin and subcutaneous tissue, unspecified: Secondary | ICD-10-CM | POA: Diagnosis not present

## 2020-08-17 DIAGNOSIS — D485 Neoplasm of uncertain behavior of skin: Secondary | ICD-10-CM

## 2020-08-17 DIAGNOSIS — L738 Other specified follicular disorders: Secondary | ICD-10-CM | POA: Diagnosis not present

## 2020-08-17 DIAGNOSIS — B079 Viral wart, unspecified: Secondary | ICD-10-CM | POA: Diagnosis not present

## 2020-08-17 NOTE — Patient Instructions (Signed)

## 2020-09-01 ENCOUNTER — Encounter: Payer: Self-pay | Admitting: Dermatology

## 2020-09-01 NOTE — Progress Notes (Signed)
   Follow-Up Visit   Subjective  Justin Coffey is a 75 y.o. male who presents for the following: Annual Exam (No new concerns).  General skin check, growth on left side of neck Location:  Duration:  Quality:  Associated Signs/Symptoms: Modifying Factors:  Severity:  Timing: Context:   Objective  Well appearing patient in no apparent distress; mood and affect are within normal limits. Mid Forehead Half dozen subtle 2 mm eccentrically umbilicated papules; dermoscopy supports [facility]  Left Anterior Neck Verrucous 43mm pearly horn       Left Ankle - Anterior, Right Ankle - Anterior Multiple 54mm textured white papules  Left Axilla 1 cm noninflamed deep dermal nodule    A full examination was performed including scalp, head, eyes, ears, nose, lips, neck, chest, axillae, abdomen, back, buttocks, bilateral upper extremities, bilateral lower extremities, hands, feet, fingers, toes, fingernails, and toenails. All findings within normal limits unless otherwise noted below.   Assessment & Plan    Sebaceous hyperplasia Mid Forehead  Told of resemblance of early basal cell carcinoma so if there were to be growth or bleeding return for biopsy  Neoplasm of uncertain behavior of skin Left Anterior Neck  Skin / nail biopsy Type of biopsy: tangential   Informed consent: discussed and consent obtained   Timeout: patient name, date of birth, surgical site, and procedure verified   Procedure prep:  Patient was prepped and draped in usual sterile fashion (Non sterile) Prep type:  Chlorhexidine Anesthesia: the lesion was anesthetized in a standard fashion   Anesthetic:  1% lidocaine w/ epinephrine 1-100,000 local infiltration Instrument used: flexible razor blade   Hemostasis achieved with: ferric subsulfate and electrodesiccation   Outcome: patient tolerated procedure well   Post-procedure details: sterile dressing applied and wound care instructions given   Dressing type:  bandage and petrolatum    Specimen 1 - Surgical pathology Differential Diagnosis: R/O BCC vs SCC vs Wart - cautery after biopsy  Check Margins: No  Acrokeratosis (2) Left Ankle - Anterior; Right Ankle - Anterior  No intervention indicated  Cyst of skin Left Axilla  Benign okay to leave if stable unless patient wants removed.       I, Lavonna Monarch, MD, have reviewed all documentation for this visit.  The documentation on 09/01/20 for the exam, diagnosis, procedures, and orders are all accurate and complete.

## 2020-09-21 DIAGNOSIS — I4891 Unspecified atrial fibrillation: Secondary | ICD-10-CM | POA: Diagnosis not present

## 2020-09-21 DIAGNOSIS — M19011 Primary osteoarthritis, right shoulder: Secondary | ICD-10-CM | POA: Diagnosis not present

## 2020-09-21 DIAGNOSIS — Z1331 Encounter for screening for depression: Secondary | ICD-10-CM | POA: Diagnosis not present

## 2020-09-21 DIAGNOSIS — I10 Essential (primary) hypertension: Secondary | ICD-10-CM | POA: Diagnosis not present

## 2020-09-21 DIAGNOSIS — R7309 Other abnormal glucose: Secondary | ICD-10-CM | POA: Diagnosis not present

## 2020-09-21 DIAGNOSIS — Z0001 Encounter for general adult medical examination with abnormal findings: Secondary | ICD-10-CM | POA: Diagnosis not present

## 2020-09-21 DIAGNOSIS — R7989 Other specified abnormal findings of blood chemistry: Secondary | ICD-10-CM | POA: Diagnosis not present

## 2020-09-21 DIAGNOSIS — Z125 Encounter for screening for malignant neoplasm of prostate: Secondary | ICD-10-CM | POA: Diagnosis not present

## 2020-09-21 DIAGNOSIS — E663 Overweight: Secondary | ICD-10-CM | POA: Diagnosis not present

## 2020-09-21 DIAGNOSIS — E782 Mixed hyperlipidemia: Secondary | ICD-10-CM | POA: Diagnosis not present

## 2020-09-21 DIAGNOSIS — Z1389 Encounter for screening for other disorder: Secondary | ICD-10-CM | POA: Diagnosis not present

## 2020-09-21 DIAGNOSIS — Z6829 Body mass index (BMI) 29.0-29.9, adult: Secondary | ICD-10-CM | POA: Diagnosis not present

## 2020-10-17 DIAGNOSIS — U071 COVID-19: Secondary | ICD-10-CM | POA: Diagnosis not present

## 2020-11-03 DIAGNOSIS — R978 Other abnormal tumor markers: Secondary | ICD-10-CM | POA: Diagnosis not present

## 2020-11-03 DIAGNOSIS — Z125 Encounter for screening for malignant neoplasm of prostate: Secondary | ICD-10-CM | POA: Diagnosis not present

## 2020-11-22 ENCOUNTER — Encounter (INDEPENDENT_AMBULATORY_CARE_PROVIDER_SITE_OTHER): Payer: Self-pay | Admitting: *Deleted

## 2020-11-24 ENCOUNTER — Ambulatory Visit: Payer: Medicare Other | Admitting: Urology

## 2020-11-28 DIAGNOSIS — Z23 Encounter for immunization: Secondary | ICD-10-CM | POA: Diagnosis not present

## 2020-12-01 ENCOUNTER — Ambulatory Visit: Payer: Medicare Other | Admitting: Urology

## 2020-12-19 ENCOUNTER — Other Ambulatory Visit: Payer: Self-pay | Admitting: Cardiology

## 2020-12-19 DIAGNOSIS — I480 Paroxysmal atrial fibrillation: Secondary | ICD-10-CM

## 2020-12-19 DIAGNOSIS — Z7901 Long term (current) use of anticoagulants: Secondary | ICD-10-CM

## 2020-12-19 NOTE — Telephone Encounter (Signed)
Eliquis 5 mg refill request received. Patient is 75 years old, weight- 99 kg, Crea- 1.04 on 09/21/19, Diagnosis-PAF and last seen by Dr. Curt Bears on 12/29/19 - has upcoming appt on 12/27/20 - will place orders to have labs drawn at that time. Will send in refill to requested pharmacy.

## 2020-12-27 ENCOUNTER — Ambulatory Visit (INDEPENDENT_AMBULATORY_CARE_PROVIDER_SITE_OTHER): Payer: Medicare Other | Admitting: Cardiology

## 2020-12-27 ENCOUNTER — Other Ambulatory Visit: Payer: Self-pay

## 2020-12-27 ENCOUNTER — Encounter: Payer: Self-pay | Admitting: Cardiology

## 2020-12-27 VITALS — BP 118/64 | HR 58 | Resp 18 | Ht 71.0 in | Wt 214.8 lb

## 2020-12-27 DIAGNOSIS — I480 Paroxysmal atrial fibrillation: Secondary | ICD-10-CM | POA: Diagnosis not present

## 2020-12-27 NOTE — Progress Notes (Signed)
Take   Electrophysiology Office Note   Date:  12/27/2020   ID:  Justin Coffey, DOB [birth year], MRN [id number]  PCP:  Sharilyn Sites, MD  Cardiologist:  [name] Primary Electrophysiologist:  Constance Haw, MD    No chief complaint on file.    History of Present Illness: Justin Coffey is a 75 y.o. male who presents today for electrophysiology evaluation.     He has a history significant for hypertension, atrial fibrillation, in sinus versus.  He had sudden onset palpitations while camping in September.  His heart was jumping around with no symptoms that woke him from sleep.  He drove himself to the emergency room and converted to sinus rhythm without need for cardioversion.  He has had atrial fibrillation in the past when electrocuted in his 101s.  Today, denies symptoms of palpitations, chest pain, shortness of breath, orthopnea, PND, lower extremity edema, claudication, dizziness, presyncope, syncope, bleeding, or neurologic sequela. The patient is tolerating medications without difficulties.  Since being seen he has done well chest pain or shortness of breath.  He is restriction.  He is unaware of further episodes of atrial fibrillation.  He continues to exercise, walking between 3 and 5 miles a day.   Past Medical History:  Diagnosis Date   Atrial fibrillation [facility])    Colon polyps    Dysrhythmia    atrial fib   Esophageal ring 03/16/08   s/p esophageal dilation Savary 34mm Dr [name]    GERD (gastroesophageal reflux disease)    hx   HTN (hypertension)    Hyperlipemia    Situs inversus    Tubular adenoma 12/30/2007   removed from sigmoid colon   Past Surgical History:  Procedure Laterality Date   COLONOSCOPY N/A 08/08/2015   Procedure: COLONOSCOPY;  Surgeon: Danie Binder, MD;  [facility];  Service: Endoscopy;  Laterality: N/A;  830   ESOPHAGOGASTRODUODENOSCOPY N/A 08/08/2015   Procedure: ESOPHAGOGASTRODUODENOSCOPY (EGD);  Surgeon:  Danie Binder, MD;  [facility];  Service: Endoscopy;  Laterality: N/A;   EXTERNAL EAR SURGERY Right    skin graft - eardrum   RESECTION OF MEDIASTINAL MASS Left 01/08/2017   Procedure: RESECTION OF LEFT SUPRACLVICULAR MASS - Lipoma - 9cm;  Surgeon: [name], MD;  Location: [place];  Service: Thoracic;  Laterality: Left;   TONSILLECTOMY     TRIGGER FINGER RELEASE Left 01/14/2013   Procedure: RELEASE A-1 PULLEY LEFT THUMB,MIDDLE AND RING FINGER;  Surgeon: Wynonia Sours, MD;  Location: [place];  Service: Orthopedics;  Laterality: Left;     Current Outpatient Medications  Medication Sig Dispense Refill   apixaban (ELIQUIS) 5 MG TABS tablet TAKE 1 TABLET TWICE A DAY 60 tablet 0   atorvastatin (LIPITOR) 20 MG tablet Take 20 mg by mouth daily.     Cholecalciferol (VITAMIN D3) 5000 units TABS Take 5,000 Units daily by mouth.      diltiazem (CARDIZEM CD) 180 MG 24 hr capsule TAKE 1 CAPSULE DAILY 90 capsule 3   diltiazem (CARDIZEM) 30 MG tablet Take 1 tablet (30 mg total) by mouth 4 (four) times daily as needed. 30 tablet 3   losartan (COZAAR) 100 MG tablet Take 100 mg by mouth daily.     Multiple Vitamin (MULTIVITAMIN) tablet Take 1 tablet by mouth daily.     Saw Palmetto, Serenoa repens, (SAW PALMETTO PO) Take 272 mg daily by mouth.     zinc gluconate 50 MG tablet  Take 50 mg by mouth daily.     No current facility-administered medications for this visit.    Allergies:   Patient has no known allergies.   Social History:  The patient  reports that he has never smoked. He has never used smokeless tobacco. He reports that he does not drink alcohol and does not use drugs.   Family History:  The patient's family history includes Healthy in his sister and sister; Lung cancer in his father and mother.    ROS:  Please see the history of present illness.   Otherwise, review of systems is positive for none.   All other systems are reviewed and negative.    PHYSICAL EXAM: VS:  BP 118/64   Pulse (!) 58   Resp 18   Ht 5\' 11"  (1.803 m)   Wt 214 lb 12.8 oz (97.4 kg)   SpO2 98%   BMI 29.96 kg/m  , BMI Body mass index is 29.96 kg/m. GEN: Well nourished, well developed, in no acute distress  HEENT: normal  Neck: no JVD, carotid bruits, or masses Cardiac: RRR; no murmurs, rubs, or gallops,no edema  Respiratory:  clear to auscultation bilaterally, normal work of breathing GI: soft, nontender, nondistended, + BS MS: no deformity or atrophy  Skin: warm and dry Neuro:  Strength and sensation are intact Psych: euthymic mood, full affect  EKG:  EKG is ordered today. Personal review of the ekg ordered shows sinus rhythm, rate 58  Recent Labs: No results found for requested labs within last 8760 hours.    Lipid Panel  No results found for: CHOL, TRIG, HDL, CHOLHDL, VLDL, LDLCALC, LDLDIRECT   Wt Readings from Last 3 Encounters:  12/27/20 214 lb 12.8 oz (97.4 kg)  12/29/19 218 lb 3.2 oz (99 kg)  12/25/18 220 lb (99.8 kg)      Other studies Reviewed: Additional studies/ records that were reviewed today include: 10/18/15 TTE Review of the above records today demonstrates:  The left ventricular size is normal. Basal left ventricular septal hypertrophy  Left ventricular systolic function is mildly reduced.  Left ventricular filling pattern is impaired. No segmental wall motion abnormalities seen in the left ventricle The right ventricular systolic function is normal. There is no significant valvular stenosis or regurgitation There is no pericardial effusion. The left atrial size is normal.  ASSESSMENT AND PLAN:  1.  Paroxysmal atrial fibrillation: Currently on Eliquis and diltiazem.  CHA2DS2-VASc of 2.  He is remained in sinus rhythm without further episodes of atrial fibrillation.  He is overall comfortable with his control.  We Dannah Ryles continue with current management.  2.  Hypertension: Currently well controlled  3.   Hyperlipidemia: Continue atorvastatin  Current medicines are reviewed at length with the patient today.   The patient does not have concerns regarding his medicines.  The following changes were made today: None  Labs/ tests ordered today include:  Orders Placed This Encounter  Procedures   EKG 12-Lead      Disposition:   FU with Avin Upperman 1 year  Signed, Arlis Everly Meredith Leeds, MD  12/27/2020 9:51 AM     [facility] [address] [place] [address] [phone] (office) [phone] (fax)

## 2020-12-29 ENCOUNTER — Other Ambulatory Visit: Payer: Self-pay | Admitting: *Deleted

## 2020-12-29 ENCOUNTER — Encounter: Payer: Self-pay | Admitting: Cardiology

## 2020-12-29 MED ORDER — APIXABAN 5 MG PO TABS: 5.0000 mg | ORAL_TABLET | Freq: Two times a day (BID) | ORAL | 0 refills | Status: DC

## 2020-12-29 NOTE — Telephone Encounter (Signed)
Eliquis 5mg  refill request received. Patient is 75 years old, weight-97.4kg, Crea-1.04 on 09/21/20 via [facility] from [employer] [facility], [place], and last seen by Dr. Curt Bears on 12/27/2020. Dose is appropriate based on dosing criteria. Will send in refill to requested pharmacy.   Pt requests on 12/29/2020 per note: I would like a 90 day refill to my current pharmacy on file and I will contact again when I change my pharmacy for next year for further refills. Thanks Justin Coffey.

## 2021-01-10 ENCOUNTER — Other Ambulatory Visit: Payer: Self-pay | Admitting: Cardiology

## 2021-01-11 ENCOUNTER — Other Ambulatory Visit: Payer: Self-pay | Admitting: Cardiology

## 2021-01-12 NOTE — Telephone Encounter (Signed)
Pt requested a 90 day supply on 12/29/20 and per that request he would let us know when he needs more.  Called pt and he states he does not need it at this time. He states the mail order will send it out on 01/17/21 and in the new year he will contact us with his new insurance and let us know where to send at that time. He was thankful for the call to ensure the refill was not needed. He will be headed to [place] for the winter and keep [place] posted.  This refill will be denied per pt request.

## 2021-01-18 ENCOUNTER — Ambulatory Visit: Payer: Medicare Other | Admitting: Gastroenterology

## 2021-02-14 ENCOUNTER — Telehealth: Payer: Self-pay | Admitting: Cardiology

## 2021-02-14 MED ORDER — APIXABAN 5 MG PO TABS: 5.0000 mg | ORAL_TABLET | Freq: Two times a day (BID) | ORAL | 2 refills | Status: DC

## 2021-02-14 MED ORDER — DILTIAZEM HCL ER COATED BEADS 180 MG PO CP24: 180.0000 mg | ORAL_CAPSULE | Freq: Every day | ORAL | 3 refills | Status: AC

## 2021-02-14 NOTE — Telephone Encounter (Signed)
Justin Coffey is calling stating he is needing his pharmacy updated on file due to an insurance change. He is requesting a callback for the pharmacy to be added and have the prescriptions sent over.

## 2021-02-14 NOTE — Telephone Encounter (Signed)
Pt last saw Dr Curt Bears 12/27/20, last labs 09/21/20 Creat 1.04 at [place] per [place], age 76, weight 97.4kg, based on specified criteria pt is on appropriate dosage of Eliquis 5mg  BID for afib.  Will refill rx.

## 2021-02-14 NOTE — Telephone Encounter (Signed)
Spoke with the patient who states that he has new insurance and needs it to be updated. He states that he is still using [facility] but insurance is through [place]. Advised patient to go onto MyChart and update his insurance information and he can upload a photo of his new card from there. Patient verbalized understanding.  Patient will also needs refills on medications. Diltiazem has been refilled.

## 2021-05-04 ENCOUNTER — Other Ambulatory Visit (INDEPENDENT_AMBULATORY_CARE_PROVIDER_SITE_OTHER): Payer: Self-pay

## 2021-05-04 DIAGNOSIS — Z86010 Personal history of colonic polyps: Secondary | ICD-10-CM

## 2021-05-16 ENCOUNTER — Telehealth (INDEPENDENT_AMBULATORY_CARE_PROVIDER_SITE_OTHER): Payer: Self-pay

## 2021-05-16 ENCOUNTER — Encounter (INDEPENDENT_AMBULATORY_CARE_PROVIDER_SITE_OTHER): Payer: Self-pay

## 2021-05-16 ENCOUNTER — Other Ambulatory Visit (INDEPENDENT_AMBULATORY_CARE_PROVIDER_SITE_OTHER): Payer: Self-pay

## 2021-05-16 ENCOUNTER — Telehealth: Payer: Self-pay | Admitting: *Deleted

## 2021-05-16 MED ORDER — PEG 3350-KCL-NA BICARB-NACL 420 G PO SOLR: 4000.0000 mL | ORAL | 0 refills | Status: DC

## 2021-05-16 NOTE — Telephone Encounter (Signed)
Justin Coffey, CMA  ?

## 2021-05-16 NOTE — Telephone Encounter (Signed)
? ?  Pre-operative Risk Assessment  ?  ?Patient Name: Justin Coffey  ?DOB: [birth year] ?MRN: [id number]  ? ?  ? ?Request for Surgical Clearance   ? ?Procedure:   COLONOSCOPY ? ?Date of Surgery:  Clearance 06/14/21                              ?   ?Surgeon:  DR. Hildred Laser ?Surgeon's Group or Practice Name:  [facility] ?Phone number:  [phone] ?Fax number:  [phone] ?  ?Type of Clearance Requested:   ?- Pharmacy:  Hold Apixaban (Eliquis) X'S 2 DAYS ?  ?Type of Anesthesia:  MAC ?  ?Additional requests/questions:   ? ?Signed, ?Jeanann Lewandowsky   ?05/16/2021, 10:03 AM  ? ?

## 2021-05-16 NOTE — Telephone Encounter (Signed)
Pt has been scheduled for a telephone visit 05/19/21 @ 11:00.  Medications have been reconciled / consent on file. ? ? ?  ?Patient Consent for Virtual Visit  ? ? ?   ? ?[name] has provided verbal consent on 05/16/2021 for a virtual visit (video or telephone). ? ? ?CONSENT FOR VIRTUAL VISIT FOR:  Justin Coffey  ?By participating in this virtual visit I agree to the following: ? ?I hereby voluntarily request, consent and authorize [facility] and its employed or contracted physicians, physician assistants, nurse practitioners or other licensed health care professionals (the Practitioner), to provide me with telemedicine health care services (the ?Services") as deemed necessary by the treating Practitioner. I acknowledge and consent to receive the Services by the Practitioner via telemedicine. I understand that the telemedicine visit will involve communicating with the Practitioner through live audiovisual communication technology and the disclosure of certain medical information by electronic transmission. I acknowledge that I have been given the opportunity to request an in-person assessment or other available alternative prior to the telemedicine visit and am voluntarily participating in the telemedicine visit. ? ?I understand that I have the right to withhold or withdraw my consent to the use of telemedicine in the course of my care at any time, without affecting my right to future care or treatment, and that the Practitioner or I may terminate the telemedicine visit at any time. I understand that I have the right to inspect all information obtained and/or recorded in the course of the telemedicine visit and may receive copies of available information for a reasonable fee.  I understand that some of the potential risks of receiving the Services via telemedicine include:  ?Delay or interruption in medical evaluation due to technological equipment failure or disruption; ?Information transmitted may not be  sufficient (e.g. poor resolution of images) to allow for appropriate medical decision making by the Practitioner; and/or  ?In rare instances, security protocols could fail, causing a breach of personal health information. ? ?Furthermore, I acknowledge that it is my responsibility to provide information about my medical history, conditions and care that is complete and accurate to the best of my ability. I acknowledge that Practitioner's advice, recommendations, and/or decision may be based on factors not within their control, such as incomplete or inaccurate data provided by me or distortions of diagnostic images or specimens that may result from electronic transmissions. I understand that the practice of medicine is not an exact science and that Practitioner makes no warranties or guarantees regarding treatment outcomes. I acknowledge that a copy of this consent can be made available to me via my patient portal ([facility]), or I can request a printed copy by calling the office of [facility].   ? ?I understand that my insurance will be billed for this visit.  ? ?I have read or had this consent read to me. ?I understand the contents of this consent, which adequately explains the benefits and risks of the Services being provided via telemedicine.  ?I have been provided ample opportunity to ask questions regarding this consent and the Services and have had my questions answered to my satisfaction. ?I give my informed consent for the services to be provided through the use of telemedicine in my medical care ? ? ? ?

## 2021-05-16 NOTE — Telephone Encounter (Signed)
Referring MD/PCP: Hilma Favors ? ?Procedure: Tcs ? ?Reason/Indication:  Hx of Polyps ? ?Has patient had this procedure before?  yes ? If so, when, by whom and where?  2017 ? ?Is there a family history of colon cancer?  no ? Who?  What age when diagnosed?   ? ?Is patient diabetic? If yes, Type 1 or Type 2   no ?     ?Does patient have prosthetic heart valve or mechanical valve?  no ? ?Do you have a pacemaker/defibrillator?  no ? ?Has patient ever had endocarditis/atrial fibrillation? yes ? ?Does patient use oxygen? no ? ?Has patient had joint replacement within last 12 months?  no ? ?Is patient constipated or do they take laxatives? no ? ?Does patient have a history of alcohol/drug use?  no ? ?Have you had a stroke/heart attack last 6 mths? no ? ?Do you take medicine for weight loss?  no ? ?For male patients,: have you had a hysterectomy N/A ?                     are you post menopausal N/A ?                     do you still have your menstrual cycle N/A ? ?Is patient on blood thinner such as Coumadin, Plavix and/or Aspirin? yes ? ?Medications: Eliquis 5 mg bid, losartan 100 mg daily, atorvastatin 20 mg daily, cardia 180 mg daily, ditiazem daily, zinc 50 mg daily, MVI daily, saw palmetto daily, Vit D3 daily ? ?Allergies: nkda ? ?Medication Adjustment per Dr Rehman/Dr Jenetta Downer HOLD ELIQUIS 2 [place] ? ?Procedure date & time: 06/14/21 AT 7:30 AM  ? ?

## 2021-05-16 NOTE — Telephone Encounter (Signed)
Clinical pharmacist to review Eliquis 

## 2021-05-16 NOTE — Telephone Encounter (Signed)
Pt has been scheduled for telephone visit, 05/19/21 @ 11:00.  Medications have been reconciled / consent on file. ?

## 2021-05-16 NOTE — Telephone Encounter (Signed)
Patient with diagnosis of afib on Eliquis for anticoagulation.   ? ?Procedure: colonoscopy ?Date of procedure: 06/14/21 ? ?CHA2DS2-VASc Score = 2  ?This indicates a 2.2% annual risk of stroke. ?The patient's score is based upon: ?CHF History: 0 ?HTN History: 1 ?Diabetes History: 0 ?Stroke History: 0 ?Vascular Disease History: 0 ?Age Score: 1 ?Gender Score: 0 ?  ?Labs 09/21/20 per KPN: ?CrCl 75 mL/min using adjusted body weight ?Platelet count 326K ? ?Per office protocol, patient can hold Eliquis for 2 days prior to procedure.   ? ?

## 2021-05-16 NOTE — Telephone Encounter (Signed)
Patient will need virtual telephone visit with preop APP.  Please schedule with a virtual preop APP visit in the later half of this week to allow time for clinical pharmacist comment on Eliquis ?

## 2021-05-19 ENCOUNTER — Ambulatory Visit (INDEPENDENT_AMBULATORY_CARE_PROVIDER_SITE_OTHER): Payer: Medicare Other | Admitting: Nurse Practitioner

## 2021-05-19 DIAGNOSIS — Z01810 Encounter for preprocedural cardiovascular examination: Secondary | ICD-10-CM

## 2021-05-19 NOTE — Progress Notes (Signed)
? ?Virtual Visit via Telephone Note  ? ?This visit type was conducted due to national recommendations for restrictions regarding the COVID-19 Pandemic (e.g. social distancing) in an effort to limit this patient's exposure and mitigate transmission in our community.  Due to his co-morbid illnesses, this patient is at least at moderate risk for complications without adequate follow up.  This format is felt to be most appropriate for this patient at this time.  The patient did not have access to video technology/had technical difficulties with video requiring transitioning to audio format only (telephone).  All issues noted in this document were discussed and addressed.  No physical exam could be performed with this format.  Please refer to the patient's chart for his  consent to telehealth for [facility]. ? ?Evaluation Performed:  Preoperative cardiovascular risk assessment ?_____________  ? ?Date:  05/19/2021  ? ?Patient ID:  Justin Coffey, Justin Coffey [birth year], MRN [id number] ?Patient Location:  ?Home ?Provider location:   ?Office ? ?Primary Care Provider:  Sharilyn Sites, MD ?Primary Cardiologist:  Dr. Curt Bears ? ?Chief Complaint  ?  ?76 y.o. y/o male with a h/o paroxysmal atrial fibrillation on Eliquis, hypertension, hyperlipidemia, and GERD, who is pending colonoscopy, and presents today for telephonic preoperative cardiovascular risk assessment. ? ?Past Medical History  ?  ?Past Medical History:  ?Diagnosis Date  ? Atrial fibrillation ([place])   ? Colon polyps   ? Dysrhythmia   ? atrial fib  ? Esophageal ring 03/16/08  ? s/p esophageal dilation Savary 77m Dr FOneida Alar  ? GERD (gastroesophageal reflux disease)   ? hx  ? HTN (hypertension)   ? Hyperlipemia   ? Situs inversus   ? Tubular adenoma 12/30/2007  ? removed from sigmoid colon  ? ?Past Surgical History:  ?Procedure Laterality Date  ? COLONOSCOPY N/A 08/08/2015  ? Procedure: COLONOSCOPY;  Surgeon: SDanie Binder MD;  [facility];  Service: Endoscopy;   Laterality: N/A;  830  ? ESOPHAGOGASTRODUODENOSCOPY N/A 08/08/2015  ? Procedure: ESOPHAGOGASTRODUODENOSCOPY (EGD);  Surgeon: SDanie Binder MD;  [facility];  Service: Endoscopy;  Laterality: N/A;  ? EXTERNAL EAR SURGERY Right   ? skin graft - eardrum  ? RESECTION OF MEDIASTINAL MASS Left 01/08/2017  ? Procedure: RESECTION OF LEFT SUPRACLVICULAR MASS - Lipoma - 9cm;  Surgeon: GGrace Isaac MD;  [place] [facility]  Service: Thoracic;  Laterality: Left;  ? TONSILLECTOMY    ? TRIGGER FINGER RELEASE Left 01/14/2013  ? Procedure: RELEASE A-1 PULLEY LEFT THUMB,MIDDLE AND RING FINGER;  Surgeon: GWynonia Sours MD;  Location[place] [facility]  Service: Orthopedics;  Laterality: Left;  ? ? ?Allergies ? ?No Known Allergies ? ?History of Present Illness  ?  ?Justin SCHORis a 76y.o. male who presents via audio/video conferencing for a telehealth visit today. Pt was last seen in cardiology clinic on 12/27/2020 by Dr. CCurt Bears At that time JJAYIN DEROUSSEwas doing well a cardiac standpoint. The patient is now pending colonoscopy scheduled for 06/14/2021 with Dr. NHildred Laserwith [facility]. Since his last visit, he he has done well from a cardiac standpoint.  He walks 3 miles a day and tolerates this well. He denies any new or concerning symptoms. ? ?He denies chest pain, palpitations, dyspnea, pnd, orthopnea, n, v, dizziness, syncope, edema, weight gain, or early satiety. All other systems reviewed and are otherwise negative except as noted above.  ? ?Home Medications  ?  ?Prior to Admission medications   ?  Medication Sig Start Date End Date Taking? Authorizing Provider  ?apixaban (ELIQUIS) 5 MG TABS tablet Take 1 tablet (5 mg total) by mouth 2 (two) times daily. 02/14/21   Camnitz, Ocie Doyne, MD  ?atorvastatin (LIPITOR) 20 MG tablet Take 20 mg by mouth daily.    [provider]  ?Cholecalciferol (VITAMIN D3) 5000 units TABS Take 5,000 Units daily by mouth.     [provider]   ?diltiazem (CARDIZEM CD) 180 MG 24 hr capsule Take 1 capsule (180 mg total) by mouth daily. 02/14/21   Camnitz, Ocie Doyne, MD  ?losartan (COZAAR) 100 MG tablet Take 100 mg by mouth daily.    [provider]  ?Multiple Vitamin (MULTIVITAMIN) tablet Take 1 tablet by mouth daily.    [provider]  ?polyethylene glycol-electrolytes (TRILYTE) 420 g solution Take 4,000 mLs by mouth as directed. ?Patient not taking: Reported on 05/16/2021 05/16/21   Rogene Houston, MD  ?[facility], [place]ren[place] repens, (SAW PALMETTO PO) Take 272 mg daily by mouth.    [provider]  ?zinc gluconate 50 MG tablet Take 50 mg by mouth daily.    [provider]  ? ? ?Physical Exam  ?  ?Vital Signs:  Justin Coffey does not have vital signs available for review today. ? ?Given telephonic nature of communication, physical exam is limited. ? ?AAOx3. NAD. Normal affect.  Speech and respirations are unlabored. ? ?Accessory Clinical Findings  ?  ?None ? ?Assessment & Plan  ?  ?1.  Preoperative Cardiovascular Risk Assessment: ? ?According to the Revised Cardiac Risk Index (RCRI), his Perioperative Risk of Major Cardiac Event is (%): 0.4. His Functional Capacity in METs is: 9.89 according to the [employer] Activity Status Index (DASI). Therefore, based on ACC/AHA guidelines, patient would be at acceptable risk for the planned procedure without further cardiovascular testing.  ? ?Patient with diagnosis of afib on Eliquis for anticoagulation.   ?  ?Procedure: colonoscopy ?Date of procedure: 06/14/21 ?  ?CHA2DS2-VASc Score = 2  ?This indicates a 2.2% annual risk of stroke. ?The patient's score is based upon: ?CHF History: 0 ?HTN History: 1 ?Diabetes History: 0 ?Stroke History: 0 ?Vascular Disease History: 0 ?Age Score: 1 ?Gender Score: 0 ?  ?Labs 09/21/20 per KPN: ?CrCl 75 mL/min using adjusted body weight ?Platelet count 326K ?  ?Per office protocol, patient can hold Eliquis for 2 days prior to procedure.  Please resume Eliquis  as soon as possible postprocedure, at the discretion of the surgeon. ? ?A copy of this note will be routed to requesting surgeon. I will route this recommendation to the requesting party via Epic fax function. ? ?Time:   ?Today, I have spent 10 minutes with the patient with telehealth technology discussing medical history, symptoms, and management plan.   ? ? ?Lenna Sciara, NP ? ?05/19/2021, 11:11 AM ? ?

## 2021-05-23 ENCOUNTER — Telehealth (INDEPENDENT_AMBULATORY_CARE_PROVIDER_SITE_OTHER): Payer: Self-pay | Admitting: *Deleted

## 2021-05-23 NOTE — Telephone Encounter (Signed)
Patient called in, wants to have EGD added to TCS schd 06/14/21 because he has heavy phlegm and some trouble swallowing, explained to patient we would have to check with Dr Laural Golden before we could add the procedure - patient is aware he may have to move procedure to different day if EGD is added ?

## 2021-05-30 ENCOUNTER — Other Ambulatory Visit (INDEPENDENT_AMBULATORY_CARE_PROVIDER_SITE_OTHER): Payer: Self-pay

## 2021-05-30 DIAGNOSIS — R1310 Dysphagia, unspecified: Secondary | ICD-10-CM

## 2021-05-30 DIAGNOSIS — Z86010 Personal history of colonic polyps: Secondary | ICD-10-CM

## 2021-06-09 NOTE — Patient Instructions (Signed)
? ? ? ? ? ? [facility] ? 06/09/2021  ?  ? '@PREFPERIOPPHARMACY'$ @ ? ? Your procedure is scheduled on  06/14/2021. ? ? Report to [facility] at  0600  A.M. ? ? Call this number if you have problems the morning of surgery: ? [phone] ? ? Remember: ? Follow the diet and prep instructions given to you by the office. ?  ? Take these medicines the morning of surgery with A SIP OF WATER  ? ?                                            diltiazem. ?  ? Do not wear jewelry, make-up or nail polish. ? Do not wear lotions, powders, or perfumes, or deodorant. ? Do not shave 48 hours prior to surgery.  Men may shave face and neck. ? Do not bring valuables to the hospital. ? [facility] is not responsible for any belongings or valuables. ? ?Contacts, dentures or bridgework may not be worn into surgery.  Leave your suitcase in the car.  After surgery it may be brought to your room. ? ?For patients admitted to the hospital, discharge time will be determined by your treatment team. ? ?Patients discharged the day of surgery will not be allowed to drive home.  ? ? ?Special instructions:   DO NOT smoke tobacco or vape for 24 hours before your procedure. ? ?Please read over the following fact sheets that you were given. ?Anesthesia Post-op Instructions and Care and Recovery After Surgery ?  ? ? ? Colonoscopy, Adult, Care After ?The following information offers guidance on how to care for yourself after your procedure. Your health care provider may also give you more specific instructions. If you have problems or questions, contact your health care provider. ?What can I expect after the procedure? ?After the procedure, it is common to have: ?A small amount of blood in your stool for 24 hours after the procedure. ?Some gas. ?Mild cramping or bloating of your abdomen. ?Follow these instructions at home: ?Eating and drinking ? ?Drink enough fluid to keep your urine pale yellow. ?Follow instructions from your health care provider about  eating or drinking restrictions. ?Resume your normal diet as told by your health care provider. Avoid heavy or fried foods that are hard to digest. ?Activity ?Rest as told by your health care provider. ?Avoid sitting for a long time without moving. Get up to take short walks every 1-2 hours. This is important to improve blood flow and breathing. Ask for help if you feel weak or unsteady. ?Return to your normal activities as told by your health care provider. Ask your health care provider what activities are safe for you. ?Managing cramping and bloating ? ?Try walking around when you have cramps or feel bloated. ?If directed, apply heat to your abdomen as told by your health care provider. Use the heat source that your health care provider recommends, such as a moist heat pack or a heating pad. ?Place a towel between your skin and the heat source. ?Leave the heat on for 20-30 minutes. ?Remove the heat if your skin turns bright red. This is especially important if you are unable to feel pain, heat, or cold. You have a greater risk of getting burned. ?General instructions ?If you were given a sedative during the procedure, it can affect you for several hours.  Do not drive or operate machinery until your health care provider says that it is safe. ?For the first 24 hours after the procedure: ?Do not sign important documents. ?Do not drink alcohol. ?Do your regular daily activities at a slower pace than normal. ?Eat soft foods that are easy to digest. ?Take over-the-counter and prescription medicines only as told by your health care provider. ?Keep all follow-up visits. This is important. ?Contact a health care provider if: ?You have blood in your stool 2-3 days after the procedure. ?Get help right away if: ?You have more than a small spotting of blood in your stool. ?You have large blood clots in your stool. ?You have swelling of your abdomen. ?You have nausea or vomiting. ?You have a fever. ?You have increasing pain in  your abdomen that is not relieved with medicine. ?These symptoms may be an emergency. Get help right away. Call 911. ?Do not wait to see if the symptoms will go away. ?Do not drive yourself to the hospital. ?Summary ?After the procedure, it is common to have a small amount of blood in your stool. You may also have mild cramping and bloating of your abdomen. ?If you were given a sedative during the procedure, it can affect you for several hours. Do not drive or operate machinery until your health care provider says that it is safe. ?Get help right away if you have a lot of blood in your stool, nausea or vomiting, a fever, or increased pain in your abdomen. ?This information is not intended to replace advice given to you by your health care provider. Make sure you discuss any questions you have with your health care provider. ?Document Revised: 09/21/2020 Document Reviewed: 09/21/2020 ?Elsevier Patient Education ? [place]. ?Monitored Anesthesia Care, Care After ?This sheet gives you information about how to care for yourself after your procedure. Your health care provider may also give you more specific instructions. If you have problems or questions, contact your health care provider. ?What can I expect after the procedure? ?After the procedure, it is common to have: ?Tiredness. ?Forgetfulness about what happened after the procedure. ?Impaired judgment for important decisions. ?Nausea or vomiting. ?Some difficulty with balance. ?Follow these instructions at home: ?For the time period you were told by your health care provider: ? ?  ? ?Rest as needed. ?Do not participate in activities where you could fall or become injured. ?Do not drive or use machinery. ?Do not drink alcohol. ?Do not take sleeping pills or medicines that cause drowsiness. ?Do not make important decisions or sign legal documents. ?Do not take care of children on your own. ?Eating and drinking ?Follow the diet that is recommended by your  health care provider. ?Drink enough fluid to keep your urine pale yellow. ?If you vomit: ?Drink water, juice, or soup when you can drink without vomiting. ?Make sure you have little or no nausea before eating solid foods. ?General instructions ?Have a responsible adult stay with you for the time you are told. It is important to have someone help care for you until you are awake and alert. ?Take over-the-counter and prescription medicines only as told by your health care provider. ?If you have sleep apnea, surgery and certain medicines can increase your risk for breathing problems. Follow instructions from your health care provider about wearing your sleep device: ?Anytime you are sleeping, including during daytime naps. ?While taking prescription pain medicines, sleeping medicines, or medicines that make you drowsy. ?Avoid smoking. ?Keep all follow-up visits as  told by your health care provider. This is important. ?Contact a health care provider if: ?You keep feeling nauseous or you keep vomiting. ?You feel light-headed. ?You are still sleepy or having trouble with balance after 24 hours. ?You develop a rash. ?You have a fever. ?You have redness or swelling around the IV site. ?Get help right away if: ?You have trouble breathing. ?You have new-onset confusion at home. ?Summary ?For several hours after your procedure, you may feel tired. You may also be forgetful and have poor judgment. ?Have a responsible adult stay with you for the time you are told. It is important to have someone help care for you until you are awake and alert. ?Rest as told. Do not drive or operate machinery. Do not drink alcohol or take sleeping pills. ?Get help right away if you have trouble breathing, or if you suddenly become confused. ?This information is not intended to replace advice given to you by your health care provider. Make sure you discuss any questions you have with your health care provider. ?Document Revised: 01/03/2021  Document Reviewed: 01/01/2019 ?Elsevier Patient Education ? [place]. ? ?

## 2021-06-12 ENCOUNTER — Encounter (HOSPITAL_COMMUNITY)
Admission: RE | Admit: 2021-06-12 | Discharge: 2021-06-12 | Disposition: A | Discharge status: Home / Self Care | Payer: Medicare Other | Source: Ambulatory Visit | Attending: Internal Medicine | Admitting: Internal Medicine

## 2021-06-12 ENCOUNTER — Encounter (HOSPITAL_COMMUNITY): Payer: Self-pay

## 2021-06-14 ENCOUNTER — Ambulatory Visit (HOSPITAL_COMMUNITY)
Admission: RE | Admit: 2021-06-14 | Discharge: 2021-06-14 | Disposition: A | Discharge status: Home / Self Care | Payer: Medicare Other | Attending: Internal Medicine | Admitting: Internal Medicine

## 2021-06-14 ENCOUNTER — Encounter (HOSPITAL_COMMUNITY): Payer: Self-pay | Admitting: Internal Medicine

## 2021-06-14 ENCOUNTER — Ambulatory Visit (HOSPITAL_BASED_OUTPATIENT_CLINIC_OR_DEPARTMENT_OTHER): Payer: Medicare Other | Admitting: Anesthesiology

## 2021-06-14 ENCOUNTER — Encounter (HOSPITAL_COMMUNITY)
Admission: RE | Disposition: A | Discharge status: Home / Self Care | Payer: Self-pay | Source: Home / Self Care | Attending: Internal Medicine

## 2021-06-14 ENCOUNTER — Encounter (INDEPENDENT_AMBULATORY_CARE_PROVIDER_SITE_OTHER): Payer: Self-pay | Admitting: *Deleted

## 2021-06-14 ENCOUNTER — Ambulatory Visit (HOSPITAL_COMMUNITY): Payer: Medicare Other | Admitting: Anesthesiology

## 2021-06-14 DIAGNOSIS — Z7901 Long term (current) use of anticoagulants: Secondary | ICD-10-CM | POA: Insufficient documentation

## 2021-06-14 DIAGNOSIS — Z1211 Encounter for screening for malignant neoplasm of colon: Secondary | ICD-10-CM | POA: Diagnosis not present

## 2021-06-14 DIAGNOSIS — K635 Polyp of colon: Secondary | ICD-10-CM

## 2021-06-14 DIAGNOSIS — Z86010 Personal history of colonic polyps: Secondary | ICD-10-CM

## 2021-06-14 DIAGNOSIS — Q893 Situs inversus: Secondary | ICD-10-CM | POA: Diagnosis not present

## 2021-06-14 DIAGNOSIS — Z79899 Other long term (current) drug therapy: Secondary | ICD-10-CM | POA: Insufficient documentation

## 2021-06-14 DIAGNOSIS — K269 Duodenal ulcer, unspecified as acute or chronic, without hemorrhage or perforation: Secondary | ICD-10-CM | POA: Insufficient documentation

## 2021-06-14 DIAGNOSIS — R1310 Dysphagia, unspecified: Secondary | ICD-10-CM

## 2021-06-14 DIAGNOSIS — D120 Benign neoplasm of cecum: Secondary | ICD-10-CM | POA: Diagnosis not present

## 2021-06-14 DIAGNOSIS — Z09 Encounter for follow-up examination after completed treatment for conditions other than malignant neoplasm: Secondary | ICD-10-CM

## 2021-06-14 DIAGNOSIS — Q394 Esophageal web: Secondary | ICD-10-CM | POA: Insufficient documentation

## 2021-06-14 DIAGNOSIS — K2970 Gastritis, unspecified, without bleeding: Secondary | ICD-10-CM | POA: Insufficient documentation

## 2021-06-14 DIAGNOSIS — I10 Essential (primary) hypertension: Secondary | ICD-10-CM | POA: Diagnosis not present

## 2021-06-14 DIAGNOSIS — R1314 Dysphagia, pharyngoesophageal phase: Secondary | ICD-10-CM | POA: Insufficient documentation

## 2021-06-14 DIAGNOSIS — K2100 Gastro-esophageal reflux disease with esophagitis, without bleeding: Secondary | ICD-10-CM

## 2021-06-14 DIAGNOSIS — K2289 Other specified disease of esophagus: Secondary | ICD-10-CM

## 2021-06-14 DIAGNOSIS — K5730 Diverticulosis of large intestine without perforation or abscess without bleeding: Secondary | ICD-10-CM | POA: Insufficient documentation

## 2021-06-14 DIAGNOSIS — K644 Residual hemorrhoidal skin tags: Secondary | ICD-10-CM | POA: Insufficient documentation

## 2021-06-14 DIAGNOSIS — I4891 Unspecified atrial fibrillation: Secondary | ICD-10-CM | POA: Diagnosis not present

## 2021-06-14 HISTORY — PX: ESOPHAGOGASTRODUODENOSCOPY (EGD) WITH PROPOFOL: SHX5813

## 2021-06-14 HISTORY — PX: BIOPSY: SHX5522

## 2021-06-14 HISTORY — PX: COLONOSCOPY WITH PROPOFOL: SHX5780

## 2021-06-14 HISTORY — PX: POLYPECTOMY: SHX5525

## 2021-06-14 HISTORY — PX: ESOPHAGEAL DILATION: SHX303

## 2021-06-14 LAB — HM COLONOSCOPY

## 2021-06-14 SURGERY — COLONOSCOPY WITH PROPOFOL
Anesthesia: General

## 2021-06-14 MED ORDER — LIDOCAINE HCL (PF) 2 % IJ SOLN
INTRAMUSCULAR | Status: AC
  Filled 2021-06-14: qty 5

## 2021-06-14 MED ORDER — PANTOPRAZOLE SODIUM 40 MG PO TBEC: 40.0000 mg | DELAYED_RELEASE_TABLET | Freq: Every day | ORAL | 5 refills | Status: DC

## 2021-06-14 MED ORDER — PROPOFOL 500 MG/50ML IV EMUL
INTRAVENOUS | Status: AC
  Filled 2021-06-14: qty 50

## 2021-06-14 MED ORDER — PROPOFOL 500 MG/50ML IV EMUL
INTRAVENOUS | Status: AC
  Filled 2021-06-14: qty 150

## 2021-06-14 MED ORDER — PHENYLEPHRINE 80 MCG/ML (10ML) SYRINGE FOR IV PUSH (FOR BLOOD PRESSURE SUPPORT)
PREFILLED_SYRINGE | INTRAVENOUS | Status: AC
  Filled 2021-06-14: qty 10

## 2021-06-14 MED ORDER — PROPOFOL 1000 MG/100ML IV EMUL
INTRAVENOUS | Status: AC
  Filled 2021-06-14: qty 100

## 2021-06-14 MED ORDER — LACTATED RINGERS IV SOLN: INTRAVENOUS | Status: DC

## 2021-06-14 MED ORDER — EPHEDRINE 5 MG/ML INJ
INTRAVENOUS | Status: AC
  Filled 2021-06-14: qty 5

## 2021-06-14 MED ORDER — PROPOFOL 10 MG/ML IV BOLUS
INTRAVENOUS | Status: DC | PRN
  Administered 2021-06-14: 20 mg via INTRAVENOUS
  Administered 2021-06-14: 70 mg via INTRAVENOUS

## 2021-06-14 MED ORDER — LIDOCAINE 2% (20 MG/ML) 5 ML SYRINGE
INTRAMUSCULAR | Status: DC | PRN
  Administered 2021-06-14: 50 mg via INTRAVENOUS

## 2021-06-14 MED ORDER — PROPOFOL 500 MG/50ML IV EMUL
INTRAVENOUS | Status: DC | PRN
Start: 2021-06-14 — End: 2021-06-14
  Administered 2021-06-14: 200 ug/kg/min via INTRAVENOUS

## 2021-06-14 NOTE — Op Note (Signed)
[facility] ?Patient Name: Justin Coffey ?Procedure Date: 06/14/2021 7:50 AM ?MRN: [id number] ?Date of Birth: Oct 17, 1945 ?Attending MD: Hildred Laser , MD ?CSN: [id number] ?Age: 76 ?Admit Type: Outpatient ?Procedure:                Colonoscopy ?Indications:              High risk colon cancer surveillance: Personal  ?                          history of colonic polyps ?Providers:                Hildred Laser, MD, Lurline Del, RN ?Referring MD:             Halford Chessman , MD ?Medicines:                Propofol per Anesthesia ?Complications:            No immediate complications. ?Estimated Blood Loss:     Estimated blood loss was minimal. ?Procedure:                Pre-Anesthesia Assessment: ?                          - Prior to the procedure, a History and Physical  ?                          was performed, and patient medications and  ?                          allergies were reviewed. The patient's tolerance of  ?                          previous anesthesia was also reviewed. The risks  ?                          and benefits of the procedure and the sedation  ?                          options and risks were discussed with the patient.  ?                          All questions were answered, and informed consent  ?                          was obtained. Prior Anticoagulants: The patient  ?                          last took Eliquis (apixaban) 3 days prior to the  ?                          procedure. ASA Grade Assessment: II - A patient  ?                          with mild systemic disease. After reviewing the  ?  risks and benefits, the patient was deemed in  ?                          satisfactory condition to undergo the procedure. ?                          After obtaining informed consent, the colonoscope  ?                          was passed under direct vision. Throughout the  ?                          procedure, the patient's blood pressure, pulse, and  ?                           oxygen saturations were monitored continuously. The  ?                          [id number]-HQ190L ([id number]) scope was introduced through  ?                          the anus and advanced to the the cecum, identified  ?                          by appendiceal orifice and ileocecal valve. The  ?                          colonoscopy was performed without difficulty. The  ?                          patient tolerated the procedure well. The quality  ?                          of the bowel preparation was excellent. The  ?                          ileocecal valve, appendiceal orifice, and rectum  ?                          were photographed. ?Scope In: 7:53:18 AM ?Scope Out: 8:06:56 AM ?Scope Withdrawal Time: 0 hours 9 minutes 29 seconds  ?Total Procedure Duration: 0 hours 13 minutes 38 seconds  ?Findings: ?     The perianal and digital rectal examinations were normal. ?     Two flat polyps were found in the cecum. The polyps were small in size.  ?     These were biopsied with a cold forceps for histology. The pathology  ?     specimen was placed into Bottle Number 3. ?     A single small-mouthed diverticulum was found in the sigmoid colon. ?     External hemorrhoids were found during retroflexion. The hemorrhoids  ?     were small. ?Impression:               - Two small polyps in the cecum. Biopsied. ?                          -  Diverticulosis in the sigmoid colon. ?                          - External hemorrhoids. ?                          Comment; position of cecum confirmed to be in left  ?                          lower quadrant by using the scope guide in a  ?                          patient with known situs inversus. ?Moderate Sedation: ?     Per Anesthesia Care ?Recommendation:           - Patient has a contact number available for  ?                          emergencies. The signs and symptoms of potential  ?                          delayed complications were discussed with the  ?                           patient. Return to normal activities tomorrow.  ?                          Written discharge instructions were provided to the  ?                          patient. ?                          - Mechanical soft diet today. ?                          - Resume Eliquis (apixaban) at prior dose in 3 days. ?                          - Await pathology results. ?                          - Repeat colonoscopy is recommended. The  ?                          colonoscopy date will be determined after pathology  ?                          results from today's exam become available for  ?                          review. ?Procedure Code(s):        --- Professional --- ?                          [phone], Colonoscopy, flexible; with biopsy, single  ?  or multiple ?Diagnosis Code(s):        --- Professional --- ?                          Z86.010, Personal history of colonic polyps ?                          K63.5, Polyp of colon ?                          K64.4, Residual hemorrhoidal skin tags ?                          K57.30, Diverticulosis of large intestine without  ?                          perforation or abscess without bleeding ?CPT copyright 2019 American Medical Association. All rights reserved. ?The codes documented in this report are preliminary and upon coder review may  ?be revised to meet current compliance requirements. ?Hildred Laser, MD ?Hildred Laser, MD ?06/14/2021 8:28:16 AM ?This report has been signed electronically. ?Number of Addenda: 0 ?

## 2021-06-14 NOTE — Op Note (Signed)
[facility] ?Patient Name: Justin Coffey ?Procedure Date: 06/14/2021 7:13 AM ?MRN: [id number] ?Date of Birth: 1946/01/25 ?Attending MD: Hildred Laser , MD ?CSN: [id number] ?Age: 76 ?Admit Type: Outpatient ?Procedure:                Upper GI endoscopy ?Indications:              Esophageal dysphagia ?Providers:                Hildred Laser, MD, Lurline Del, RN ?Referring MD:             Halford Chessman MD, MD ?Medicines:                Propofol per Anesthesia ?Complications:            No immediate complications. ?Estimated Blood Loss:     Estimated blood loss was minimal. ?Procedure:                Pre-Anesthesia Assessment: ?                          - Prior to the procedure, a History and Physical  ?                          was performed, and patient medications and  ?                          allergies were reviewed. The patient's tolerance of  ?                          previous anesthesia was also reviewed. The risks  ?                          and benefits of the procedure and the sedation  ?                          options and risks were discussed with the patient.  ?                          All questions were answered, and informed consent  ?                          was obtained. Prior Anticoagulants: The patient  ?                          last took Eliquis (apixaban) 3 days prior to the  ?                          procedure. ASA Grade Assessment: II - A patient  ?                          with mild systemic disease. After reviewing the  ?                          risks and benefits, the patient was deemed in  ?  satisfactory condition to undergo the procedure. ?                          After obtaining informed consent, the endoscope was  ?                          passed under direct vision. Throughout the  ?                          procedure, the patient's blood pressure, pulse, and  ?                          oxygen saturations were monitored continuously. The  ?                           GIF-H190 ([id number]) scope was introduced through the  ?                          mouth, and advanced to the second part of duodenum.  ?                          The upper GI endoscopy was accomplished without  ?                          difficulty. The patient tolerated the procedure  ?                          well. ?Scope In: 7:33:03 AM ?Scope Out: 7:48:51 AM ?Total Procedure Duration: 0 hours 15 minutes 48 seconds  ?Findings: ?     The hypopharynx was normal. ?     Mild mucosal changes characterized by longitudinal markings were found  ?     in the proximal esophagus and in the mid esophagus. Biopsies were taken  ?     with a cold forceps for histology. The pathology specimen was placed  ?     into Bottle Number 2. ?     LA Grade A (one or more mucosal breaks less than 5 mm, not extending  ?     between tops of 2 mucosal folds) esophagitis was found 40 cm from the  ?     incisors. ?     A web was found in the proximal esophagus. The scope was withdrawn.  ?     Dilation was performed with a Maloney dilator with mild resistance at 54  ?     Fr. The dilation site was examined following endoscope reinsertion and  ?     showed mild mucosal disruption, mild improvement in luminal narrowing  ?     and no perforation. ?     The cardia, gastric fundus and gastric body were normal. Altered anatomy  ?     of the stomach due to situs inversus. Gastric body located to the left  ?     of fundus. ?     Patchy mild inflammation characterized by congestion (edema), erythema  ?     and granularity was found in the gastric antrum. Biopsies were taken  ?     with a cold forceps for histology. The pathology specimen  was placed  ?     into Bottle Number 1. ?     A few erosions without bleeding were found in the duodenal bulb. ?     The second portion of the duodenum was normal. ?Impression:               - Normal hypopharynx. ?                          - Longitudinally marked mucosa in the esophagus.  ?                           Biopsied. ?                          - LA Grade A reflux esophagitis. ?                          - Web in the proximal esophagus. Dilated. ?                          - Altered anatomy secondary to situs inversus ?                          - Normal cardia, gastric fundus and gastric body. ?                          - Gastritis. Biopsied. ?                          - Duodenal erosions without bleeding. ?                          - Normal second portion of the duodenum. ?Moderate Sedation: ?     Per Anesthesia Care ?Recommendation:           - Patient has a contact number available for  ?                          emergencies. The signs and symptoms of potential  ?                          delayed complications were discussed with the  ?                          patient. Return to normal activities tomorrow.  ?                          Written discharge instructions were provided to the  ?                          patient. ?                          - Mechanical soft diet today. ?                          - Continue present medications. ?                          -  Pantoprazole 40 mg by mouth 30 minutes before  ?                          breakfast daily. ?                          - Resume Eliquis (apixaban) at prior dose in 3 days. ?                          - Await pathology results. ?Procedure Code(s):        --- Professional --- ?                          [phone], Esophagogastroduodenoscopy, flexible,  ?                          transoral; with biopsy, single or multiple ?                          43450, Dilation of esophagus, by unguided sound or  ?                          bougie, single or multiple passes ?Diagnosis Code(s):        --- Professional --- ?                          K22.8, Other specified diseases of esophagus ?                          K21.00, Gastro-esophageal reflux disease with  ?                          esophagitis, without bleeding ?                          Q39.4, Esophageal web ?                           K29.70, Gastritis, unspecified, without bleeding ?                          K26.9, Duodenal ulcer, unspecified as acute or  ?                          chronic, without hemorrhage or perforation ?                          R13.14, Dysphagia, pharyngoesophageal phase ?CPT copyright 2019 American Medical Association. All rights reserved. ?The codes documented in this report are preliminary and upon coder review may  ?be revised to meet current compliance requirements. ?Hildred Laser, MD ?Hildred Laser, MD ?06/14/2021 8:23:42 AM ?This report has been signed electronically. ?Number of Addenda: 0 ?

## 2021-06-14 NOTE — H&P (Signed)
Justin Coffey is an 76 y.o. male.   ?Chief Complaint: Patient is here for esophagogastroduodenoscopy with esophageal dilation and colonoscopy. ?HPI: Patient is 76 year old Caucasian male who has a history of colonic adenomas and is here for surveillance colonoscopy.  Her last exam was in 2017 with removal of 1 tubular adenoma.  He has had adenomas on prior colonoscopy.  He denies change in bowel habits abdominal pain or rectal bleeding.  He does complain of intermittent dysphagia to solids particularly chicken and rice.  He points to suprasternal area as site of bolus obstruction.  He has a history of GERD but he has been able to control his symptoms of watching his diet. ?Last  Eliquis dose was 3 days ago. ? ? ?Past Medical History:  ?Diagnosis Date  ? Atrial fibrillation ([place])   ? Colon polyps   ? Dysrhythmia   ? atrial fib  ? Esophageal ring 03/16/08  ? s/p esophageal dilation Savary 78m Dr FOneida Alar  ? GERD (gastroesophageal reflux disease)   ? hx  ? HTN (hypertension)   ? Hyperlipemia   ? Situs inversus   ? Tubular adenoma 12/30/2007  ? removed from sigmoid colon  ? ? ?Past Surgical History:  ?Procedure Laterality Date  ? COLONOSCOPY N/A 08/08/2015  ? Procedure: COLONOSCOPY;  Surgeon: SDanie Binder MD;  [facility];  Service: Endoscopy;  Laterality: N/A;  830  ? ESOPHAGOGASTRODUODENOSCOPY N/A 08/08/2015  ? Procedure: ESOPHAGOGASTRODUODENOSCOPY (EGD);  Surgeon: SDanie Binder MD;  [facility];  Service: Endoscopy;  Laterality: N/A;  ? EXTERNAL EAR SURGERY Right   ? skin graft - eardrum  ? RESECTION OF MEDIASTINAL MASS Left 01/08/2017  ? Procedure: RESECTION OF LEFT SUPRACLVICULAR MASS - Lipoma - 9cm;  Surgeon: GGrace Isaac MD;  [place] [facility]  Service: Thoracic;  Laterality: Left;  ? TONSILLECTOMY    ? TRIGGER FINGER RELEASE Left 01/14/2013  ? Procedure: RELEASE A-1 PULLEY LEFT THUMB,MIDDLE AND RING FINGER;  Surgeon: GWynonia Sours MD;  [place] [facility]   Service: Orthopedics;  Laterality: Left;  ? ? ?Family History  ?Problem Relation Age of Onset  ? Lung cancer Mother   ? Lung cancer Father   ? Healthy Sister   ? Healthy Sister   ? Colon cancer Neg Hx   ? ?Social History:  reports that he has never smoked. He has never used smokeless tobacco. He reports that he does not drink alcohol and does not use drugs. ? ?Allergies: No Known Allergies ? ?Medications Prior to Admission  ?Medication Sig Dispense Refill  ? apixaban (ELIQUIS) 5 MG TABS tablet Take 1 tablet (5 mg total) by mouth 2 (two) times daily. 180 tablet 2  ? atorvastatin (LIPITOR) 20 MG tablet Take 20 mg by mouth daily.    ? Cholecalciferol (VITAMIN D3) 5000 units TABS Take 5,000 Units daily by mouth.     ? diltiazem (CARDIZEM CD) 180 MG 24 hr capsule Take 1 capsule (180 mg total) by mouth daily. 90 capsule 3  ? losartan (COZAAR) 100 MG tablet Take 100 mg by mouth daily.    ? Multiple Vitamin (MULTIVITAMIN) tablet Take 1 tablet by mouth daily.    ? Saw Palmetto, Serenoa repens, (SAW PALMETTO PO) Take 1 capsule by mouth daily.    ? zinc gluconate 50 MG tablet Take 50 mg by mouth daily.    ? polyethylene glycol-electrolytes (TRILYTE) 420 g solution Take 4,000 mLs by mouth as directed. (Patient not taking:  Reported on 05/16/2021) 4000 mL 0  ? ? ?No results found for this or any previous visit (from the past 48 hour(s)). ?No results found. ? ?Review of Systems ? ?Blood pressure (!) 146/76, pulse (!) 59, temperature 98.4 ?F (36.9 ?C), resp. rate 16, SpO2 98 %. ?Physical Exam ?HENT:  ?   Mouth/Throat:  ?   Mouth: Mucous membranes are moist.  ?   Pharynx: Oropharynx is clear.  ?Eyes:  ?   General: No scleral icterus. ?   Conjunctiva/sclera: Conjunctivae normal.  ?Cardiovascular:  ?   Rate and Rhythm: Normal rate and regular rhythm.  ?   Heart sounds: Normal heart sounds. No murmur heard. ?Pulmonary:  ?   Effort: Pulmonary effort is normal.  ?   Breath sounds: Normal breath sounds.  ?Abdominal:  ?   General: There is  no distension.  ?   Palpations: Abdomen is soft. There is no mass.  ?   Tenderness: There is no abdominal tenderness.  ?Musculoskeletal:     ?   General: No swelling.  ?   Cervical back: Neck supple.  ?Lymphadenopathy:  ?   Cervical: No cervical adenopathy.  ?Skin: ?   General: Skin is warm and dry.  ?Neurological:  ?   Mental Status: He is alert.  ?  ? ?Assessment/Plan ? ?Esophageal dysphagia. ?History of colonic adenomas. ?Esophagogastroduodenoscopy with esophageal dilation and surveillance colonoscopy. ? ?Hildred Laser, MD ?06/14/2021, 7:25 AM ? ? ? ?

## 2021-06-14 NOTE — Anesthesia Postprocedure Evaluation (Signed)
Anesthesia Post Note ? ?Patient: STEIN WINDHORST ? ?Procedure(s) Performed: COLONOSCOPY WITH PROPOFOL ?ESOPHAGOGASTRODUODENOSCOPY (EGD) WITH PROPOFOL ?ESOPHAGEAL DILATION ?BIOPSY ?POLYPECTOMY ? ?Patient location during evaluation: Phase II ?Anesthesia Type: General ?Level of consciousness: awake ?Pain management: pain level controlled ?Vital Signs Assessment: post-procedure vital signs reviewed and stable ?Respiratory status: spontaneous breathing and respiratory function stable ?Cardiovascular status: blood pressure returned to baseline and stable ?Postop Assessment: no headache and no apparent nausea or vomiting ?Anesthetic complications: no ?Comments: Late entry ? ? ?No notable events documented. ? ? ?Last Vitals:  ?Vitals:  ? 06/14/21 0645 06/14/21 0813  ?BP: (!) 146/76 (!) 96/57  ?Pulse: (!) 59 60  ?Resp: 16 14  ?Temp: 36.9 ?C 36.7 ?C  ?SpO2: 98% 99%  ?  ?Last Pain:  ?Vitals:  ? 06/14/21 0813  ?TempSrc: Oral  ?PainSc: 0-No pain  ? ? ?  ?  ?  ?  ?  ?  ? ?Louann Sjogren ? ? ? ? ?

## 2021-06-14 NOTE — Transfer of Care (Signed)
Immediate Anesthesia Transfer of Care Note ? ?Patient: ALLAH REASON ? ?Procedure(s) Performed: COLONOSCOPY WITH PROPOFOL ?ESOPHAGOGASTRODUODENOSCOPY (EGD) WITH PROPOFOL ?ESOPHAGEAL DILATION ?BIOPSY ?POLYPECTOMY ? ?Patient Location: Short Stay ? ?Anesthesia Type:MAC ? ?Level of Consciousness: sedated and patient cooperative ? ?Airway & Oxygen Therapy: Patient Spontanous Breathing and Patient connected to nasal cannula oxygen ? ?Post-op Assessment: Report given to RN, Post -op Vital signs reviewed and stable and Patient moving all extremities ? ?Post vital signs: Reviewed and stable ? ?Last Vitals:  ?Vitals Value Taken Time  ?BP 96/57 06/14/21 0813  ?Temp 36.7 ?C 06/14/21 0813  ?Pulse 60 06/14/21 0813  ?Resp 14 06/14/21 0813  ?SpO2 99 % 06/14/21 0813  ? ? ?Last Pain:  ?Vitals:  ? 06/14/21 0813  ?TempSrc: Oral  ?PainSc: 0-No pain  ?   ? ?  ? ?Complications: No notable events documented. ?

## 2021-06-14 NOTE — Anesthesia Preprocedure Evaluation (Signed)
Anesthesia Evaluation  ?Patient identified by MRN, date of birth, ID band ?Patient awake ? ? ? ?Reviewed: ?Allergy & Precautions, H&P , NPO status , Patient's Chart, lab work & pertinent test results, reviewed documented beta blocker date and time  ? ?Airway ?Mallampati: II ? ?TM Distance: >3 FB ?Neck ROM: full ? ? ? Dental ?no notable dental hx. ? ?  ?Pulmonary ?neg pulmonary ROS,  ?  ?Pulmonary exam normal ?breath sounds clear to auscultation ? ? ? ? ? ? Cardiovascular ?Exercise Tolerance: Good ?hypertension, + dysrhythmias Atrial Fibrillation  ?Rhythm:irregular Rate:Normal ? ? ?  ?Neuro/Psych ?negative neurological ROS ? negative psych ROS  ? GI/Hepatic ?Neg liver ROS, GERD  Medicated,  ?Endo/Other  ?negative endocrine ROS ? Renal/GU ?negative Renal ROS  ?negative genitourinary ?  ?Musculoskeletal ? ? Abdominal ?  ?Peds ? Hematology ?negative hematology ROS ?(+)   ?Anesthesia Other Findings ? ? Reproductive/Obstetrics ?negative OB ROS ? ?  ? ? ? ? ? ? ? ? ? ? ? ? ? ?  ?  ? ? ? ? ? ? ? ? ?Anesthesia Physical ?Anesthesia Plan ? ?ASA: 2 ? ?Anesthesia Plan: General  ? ?Post-op Pain Management:   ? ?Induction:  ? ?PONV Risk Score and Plan: Propofol infusion ? ?Airway Management Planned:  ? ?Additional Equipment:  ? ?Intra-op Plan:  ? ?Post-operative Plan:  ? ?Informed Consent: I have reviewed the patients History and Physical, chart, labs and discussed the procedure including the risks, benefits and alternatives for the proposed anesthesia with the patient or authorized representative who has indicated his/her understanding and acceptance.  ? ? ? ?Dental Advisory Given ? ?Plan Discussed with: CRNA ? ?Anesthesia Plan Comments:   ? ? ? ? ? ? ?Anesthesia Quick Evaluation ? ?

## 2021-06-14 NOTE — Discharge Instructions (Addendum)
Resume Eliquis on 06/17/2021. ?Resume other medications as before. ?Pantoprazole 40 mg by mouth 30 minutes before breakfast daily. ?Mechanical soft diet for 24 hours and that after your usual diet. ?No driving for 24 hours. ?Physician will call with biopsy results. ?

## 2021-06-15 LAB — SURGICAL PATHOLOGY

## 2021-06-21 ENCOUNTER — Encounter (HOSPITAL_COMMUNITY): Payer: Self-pay | Admitting: Internal Medicine

## 2021-06-29 ENCOUNTER — Telehealth (INDEPENDENT_AMBULATORY_CARE_PROVIDER_SITE_OTHER): Payer: Self-pay | Admitting: Internal Medicine

## 2021-06-29 NOTE — Telephone Encounter (Signed)
Patient came into the office to let Dr Laural Golden know how he is doing since his procedure - stated he is still having some phlegm in throat - stated Dr Laural Golden otherwise is doing good

## 2021-07-19 ENCOUNTER — Ambulatory Visit (INDEPENDENT_AMBULATORY_CARE_PROVIDER_SITE_OTHER): Payer: Medicare Other | Admitting: Dermatology

## 2021-07-19 ENCOUNTER — Encounter: Payer: Self-pay | Admitting: Dermatology

## 2021-07-19 DIAGNOSIS — Z808 Family history of malignant neoplasm of other organs or systems: Secondary | ICD-10-CM | POA: Diagnosis not present

## 2021-07-19 DIAGNOSIS — L821 Other seborrheic keratosis: Secondary | ICD-10-CM | POA: Diagnosis not present

## 2021-07-19 DIAGNOSIS — L309 Dermatitis, unspecified: Secondary | ICD-10-CM | POA: Diagnosis not present

## 2021-07-19 DIAGNOSIS — L738 Other specified follicular disorders: Secondary | ICD-10-CM | POA: Diagnosis not present

## 2021-07-19 DIAGNOSIS — Z1283 Encounter for screening for malignant neoplasm of skin: Secondary | ICD-10-CM

## 2021-07-19 MED ORDER — CLOBETASOL PROPIONATE 0.05 % EX OINT: TOPICAL_OINTMENT | CUTANEOUS | 0 refills | Status: DC

## 2021-07-27 NOTE — Telephone Encounter (Signed)
Patient says his swallowing is fine but he keeps having phlegm. He is not having regurgitation or coughing spells while he is eating. I suspect he has postnasal drip.  He can try Flonase for 1 to 2 weeks and if it does not work he can try Claritin or Zyrtec and if these measures do not work next I would be referral to ENT specialist.  Patient will let us know

## 2021-08-07 ENCOUNTER — Encounter: Payer: Self-pay | Admitting: Dermatology

## 2021-08-08 DIAGNOSIS — H35373 Puckering of macula, bilateral: Secondary | ICD-10-CM | POA: Diagnosis not present

## 2021-08-14 ENCOUNTER — Ambulatory Visit: Payer: Medicare Other | Admitting: Cardiology

## 2021-08-14 ENCOUNTER — Encounter: Payer: Self-pay | Admitting: *Deleted

## 2021-08-14 ENCOUNTER — Ambulatory Visit (INDEPENDENT_AMBULATORY_CARE_PROVIDER_SITE_OTHER): Payer: Medicare Other

## 2021-08-14 ENCOUNTER — Telehealth: Payer: Self-pay | Admitting: *Deleted

## 2021-08-14 DIAGNOSIS — I480 Paroxysmal atrial fibrillation: Secondary | ICD-10-CM

## 2021-08-14 DIAGNOSIS — R002 Palpitations: Secondary | ICD-10-CM

## 2021-08-14 NOTE — Telephone Encounter (Signed)
Followed up with pt after seeing he was added on to our schedule this afternoon. Pt reports some irregular heart beats over the past 3/4 days. States he has episodes where it "beats real hard", feels palpitations. He has an "afib app" but this is not showing that he is in afib. Denies any CP, sob, edema, dizziness/syncope. Discussed with Dr. Curt Bears. Pt aware that we are going to order a monitor to be mailed to his home address (verified address). Informed that we are happy to see him but not sure it is worth him driving to [place] from [place] if we can advised over the phone. Pt agreeable to this and would like to wear the monitor. Aware instructions will be sent via mychart. Advised to call office if needs to readdress being seen prior to monitor completed and reviewed by MD. Patient verbalized understanding and agreeable to plan.

## 2021-08-14 NOTE — Progress Notes (Unsigned)
Enrolled for [employer] to mail a ZIO XT long term holter monitor to the patients address on file.  

## 2021-08-17 DIAGNOSIS — I480 Paroxysmal atrial fibrillation: Secondary | ICD-10-CM

## 2021-08-17 DIAGNOSIS — R002 Palpitations: Secondary | ICD-10-CM | POA: Diagnosis not present

## 2021-09-06 DIAGNOSIS — I480 Paroxysmal atrial fibrillation: Secondary | ICD-10-CM | POA: Diagnosis not present

## 2021-09-06 DIAGNOSIS — R002 Palpitations: Secondary | ICD-10-CM | POA: Diagnosis not present

## 2021-09-25 DIAGNOSIS — I10 Essential (primary) hypertension: Secondary | ICD-10-CM | POA: Diagnosis not present

## 2021-09-25 DIAGNOSIS — K219 Gastro-esophageal reflux disease without esophagitis: Secondary | ICD-10-CM | POA: Diagnosis not present

## 2021-09-25 DIAGNOSIS — E782 Mixed hyperlipidemia: Secondary | ICD-10-CM | POA: Diagnosis not present

## 2021-09-25 DIAGNOSIS — M19011 Primary osteoarthritis, right shoulder: Secondary | ICD-10-CM | POA: Diagnosis not present

## 2021-09-25 DIAGNOSIS — Z0001 Encounter for general adult medical examination with abnormal findings: Secondary | ICD-10-CM | POA: Diagnosis not present

## 2021-09-25 DIAGNOSIS — I480 Paroxysmal atrial fibrillation: Secondary | ICD-10-CM | POA: Diagnosis not present

## 2021-09-25 DIAGNOSIS — Z1331 Encounter for screening for depression: Secondary | ICD-10-CM | POA: Diagnosis not present

## 2021-09-25 DIAGNOSIS — Z125 Encounter for screening for malignant neoplasm of prostate: Secondary | ICD-10-CM | POA: Diagnosis not present

## 2021-09-25 DIAGNOSIS — E663 Overweight: Secondary | ICD-10-CM | POA: Diagnosis not present

## 2021-09-25 DIAGNOSIS — E7849 Other hyperlipidemia: Secondary | ICD-10-CM | POA: Diagnosis not present

## 2021-09-25 DIAGNOSIS — R7309 Other abnormal glucose: Secondary | ICD-10-CM | POA: Diagnosis not present

## 2021-09-25 DIAGNOSIS — Z6829 Body mass index (BMI) 29.0-29.9, adult: Secondary | ICD-10-CM | POA: Diagnosis not present

## 2021-12-02 DIAGNOSIS — Z23 Encounter for immunization: Secondary | ICD-10-CM | POA: Diagnosis not present

## 2021-12-12 NOTE — Progress Notes (Unsigned)
Cardiology Office Note Date:  12/12/2021  Patient ID:  Justin, Coffey 04/25/1945, MRN [id number] PCP:  Sharilyn Sites, MD  Electrophysiologist: Dr. Curt Bears  ***refresh   Chief Complaint: *** annual visit   History of Present Illness: Justin Coffey is a 76 y.o. male with history of situs inversus, AFib, HTN, HLD, esophageal ring (previously dilated).  He saw Dr. Curt Bears Nov 2022, , did not think he had further Afib, exercising with good exertional capacity, happy with his rhythm management. No changes were made.  *** symptoms *** eliquis, bleeding, dose *** labs, lipids...  AFib/AAD hx Electrocuted in his 79's had an episode of Afib with this Diagnosed otherwise Oct 2017 No AAD to date  Past Medical History:  Diagnosis Date   Atrial fibrillation ([place])    Atypical mole 09/26/2016   Left Lower Back (moderate)   Basal cell carcinoma 06/04/2011   Right Upper Inner Nose [facility])   Colon polyps    Dysrhythmia    atrial fib   Esophageal ring 03/16/2008   s/p esophageal dilation Savary 59m Dr FOneida Alar   GERD (gastroesophageal reflux disease)    hx   HTN (hypertension)    Hyperlipemia    Situs inversus    Tubular adenoma 12/30/2007   removed from sigmoid colon    Past Surgical History:  Procedure Laterality Date   BIOPSY  06/14/2021   Procedure: BIOPSY;  Surgeon: RRogene Houston MD;  [facility];  Service: Endoscopy;;  gastric,esophageal   COLONOSCOPY N/A 08/08/2015   Procedure: COLONOSCOPY;  Surgeon: SDanie Binder MD;  [facility];  Service: Endoscopy;  Laterality: N/A;  830   COLONOSCOPY WITH PROPOFOL N/A 06/14/2021   Procedure: COLONOSCOPY WITH PROPOFOL;  Surgeon: RRogene Houston MD;  [facility];  Service: Endoscopy;  Laterality: N/A;  730   ESOPHAGEAL DILATION N/A 06/14/2021   Procedure: ESOPHAGEAL DILATION;  Surgeon: RRogene Houston MD;  [facility];  Service: Endoscopy;  Laterality: N/A;    ESOPHAGOGASTRODUODENOSCOPY N/A 08/08/2015   Procedure: ESOPHAGOGASTRODUODENOSCOPY (EGD);  Surgeon: SDanie Binder MD;  [facility];  Service: Endoscopy;  Laterality: N/A;   ESOPHAGOGASTRODUODENOSCOPY (EGD) WITH PROPOFOL N/A 06/14/2021   Procedure: ESOPHAGOGASTRODUODENOSCOPY (EGD) WITH PROPOFOL;  Surgeon: RRogene Houston MD;  [facility];  Service: Endoscopy;  Laterality: N/A;   EXTERNAL EAR SURGERY Right    skin graft - eardrum   POLYPECTOMY  06/14/2021   Procedure: POLYPECTOMY;  Surgeon: RRogene Houston MD;  [facility];  Service: Endoscopy;;  cecal x2;   RESECTION OF MEDIASTINAL MASS Left 01/08/2017   Procedure: RESECTION OF LEFT SUPRACLVICULAR MASS - Lipoma - 9cm;  Surgeon: GGrace Isaac MD;  [place] [facility]  Service: Thoracic;  Laterality: Left;   TONSILLECTOMY     TRIGGER FINGER RELEASE Left 01/14/2013   Procedure: RELEASE A-1 PULLEY LEFT THUMB,MIDDLE AND RING FINGER;  Surgeon: GWynonia Sours MD;  [facility] [facility]  Service: Orthopedics;  Laterality: Left;    Current Outpatient Medications  Medication Sig Dispense Refill   apixaban (ELIQUIS) 5 MG TABS tablet Take 1 tablet (5 mg total) by mouth 2 (two) times daily. 180 tablet 2   atorvastatin (LIPITOR) 20 MG tablet Take 20 mg by mouth daily.     Cholecalciferol (VITAMIN D3) 5000 units TABS Take 5,000 Units daily by mouth.      clobetasol ointment (TEMOVATE) 0.05 % Apply to affected  area daily after bathing. 60 g 0   diltiazem (CARDIZEM CD) 180 MG 24 hr capsule Take 1 capsule (180 mg total) by mouth daily. 90 capsule 3   losartan (COZAAR) 100 MG tablet Take 100 mg by mouth daily.     Multiple Vitamin (MULTIVITAMIN) tablet Take 1 tablet by mouth daily.     pantoprazole (PROTONIX) 40 MG tablet Take 1 tablet (40 mg total) by mouth daily before breakfast. 30 tablet 5   Saw Palmetto, Serenoa repens, (SAW PALMETTO PO) Take 1 capsule by mouth daily.     zinc gluconate 50 MG tablet Take  50 mg by mouth daily.     No current facility-administered medications for this visit.    Allergies:   Patient has no known allergies.   Social History:  The patient  reports that he has never smoked. He has never used smokeless tobacco. He reports that he does not drink alcohol and does not use drugs.   Family History:  The patient's family history includes Healthy in his sister and sister; Lung cancer in his father and mother.  ROS:  Please see the history of present illness.    All other systems are reviewed and otherwise negative.   PHYSICAL EXAM:  VS:  There were no vitals taken for this visit. BMI: There is no height or weight on file to calculate BMI. Well nourished, well developed, in no acute distress HEENT: normocephalic, atraumatic Neck: no JVD, carotid bruits or masses Cardiac:  *** RRR; no significant murmurs, no rubs, or gallops Lungs:  *** CTA b/l, no wheezing, rhonchi or rales Abd: soft, nontender MS: no deformity or *** atrophy Ext: *** no edema Skin: warm and dry, no rash Neuro:  No gross deficits appreciated Psych: euthymic mood, full affect   EKG:  Done today and reviewed by myself shows  ***  01/13/2016: TTE LV size is normal, systolic function mildly reduced Basal septal hypertrophy Impaired filling, no WMA  Recent Labs: No results found for requested labs within last 365 days.  No results found for requested labs within last 365 days.   CrCl cannot be calculated (Patient's most recent lab result is older than the maximum 21 days allowed.).   Wt Readings from Last 3 Encounters:  12/27/20 214 lb 12.8 oz (97.4 kg)  12/29/19 218 lb 3.2 oz (99 kg)  12/25/18 220 lb (99.8 kg)     Other studies reviewed: Additional studies/records reviewed today include: summarized above  ASSESSMENT AND PLAN:  Paroxysmal Afib CHA2DS2Vasc is 2, on Eliquis, appropriately dosed *** burden by symptoms  2.  HTN *** Disposition: F/u with ***  Current medicines are  reviewed at length with the patient today.  The patient did not have any concerns regarding medicines.  Justin Night, PA-C 12/12/2021 7:30 AM     [facility] [address] [place] [address] [phone] (office)  [phone] (fax)

## 2021-12-14 ENCOUNTER — Encounter: Payer: Self-pay | Admitting: Physician Assistant

## 2021-12-14 ENCOUNTER — Ambulatory Visit: Payer: Medicare Other | Attending: Physician Assistant | Admitting: Physician Assistant

## 2021-12-14 VITALS — BP 148/70 | HR 62 | Ht 71.0 in | Wt 214.6 lb

## 2021-12-14 DIAGNOSIS — I480 Paroxysmal atrial fibrillation: Secondary | ICD-10-CM

## 2021-12-14 DIAGNOSIS — I10 Essential (primary) hypertension: Secondary | ICD-10-CM

## 2021-12-14 NOTE — Patient Instructions (Signed)
Medication Instructions:   Your physician recommends that you continue on your current medications as directed. Please refer to the Current Medication list given to you today.    *If you need a refill on your cardiac medications before your next appointment, please call your pharmacy*   Lab Work:  [place]    If you have labs (blood work) drawn today and your tests are completely normal, you will receive your results only by: [place] (if you have MyChart) OR A paper copy in the mail If you have any lab test that is abnormal or we need to change your treatment, we will call you to review the results.   Testing/Procedures: NONE ORDERED  TODAY      Follow-Up: At [facility], you and your health needs are our priority.  As part of our continuing mission to provide you with exceptional heart care, we have created designated Provider Care Teams.  These Care Teams include your primary Cardiologist (physician) and Advanced Practice Providers (APPs -  Physician Assistants and Nurse Practitioners) who all work together to provide you with the care you need, when you need it.  We recommend signing up for the patient portal called "MyChart".  Sign up information is provided on this After Visit Summary.  MyChart is used to connect with patients for Virtual Visits (Telemedicine).  Patients are able to view lab/test results, encounter notes, upcoming appointments, etc.  Non-urgent messages can be sent to your provider as well.   To learn more about what you can do with MyChart, go to [email or web address].    Your next appointment:   1 year(s)  The format for your next appointment:   In Person  Provider:   You may see Will Meredith Leeds, MD or one of the following Advanced Practice Providers on your designated Care Team:   Tommye Standard, [place]      Other Instructions   Important Information About Sugar

## 2021-12-18 ENCOUNTER — Other Ambulatory Visit (INDEPENDENT_AMBULATORY_CARE_PROVIDER_SITE_OTHER): Payer: Self-pay | Admitting: Internal Medicine

## 2021-12-18 NOTE — Telephone Encounter (Signed)
06/14/2021 EGD and TCS, need to call the patient to find out if still taking.

## 2021-12-19 ENCOUNTER — Other Ambulatory Visit (HOSPITAL_COMMUNITY): Payer: Self-pay | Admitting: Family Medicine

## 2021-12-19 ENCOUNTER — Ambulatory Visit (HOSPITAL_COMMUNITY)
Admission: RE | Admit: 2021-12-19 | Discharge: 2021-12-19 | Disposition: A | Discharge status: Home / Self Care | Payer: Medicare Other | Source: Ambulatory Visit | Attending: Family Medicine | Admitting: Family Medicine

## 2021-12-19 DIAGNOSIS — M7989 Other specified soft tissue disorders: Secondary | ICD-10-CM | POA: Insufficient documentation

## 2021-12-19 DIAGNOSIS — E663 Overweight: Secondary | ICD-10-CM | POA: Diagnosis not present

## 2021-12-19 DIAGNOSIS — M25572 Pain in left ankle and joints of left foot: Secondary | ICD-10-CM | POA: Diagnosis not present

## 2021-12-19 DIAGNOSIS — M7732 Calcaneal spur, left foot: Secondary | ICD-10-CM | POA: Diagnosis not present

## 2021-12-19 DIAGNOSIS — Z6829 Body mass index (BMI) 29.0-29.9, adult: Secondary | ICD-10-CM | POA: Diagnosis not present

## 2021-12-19 NOTE — Telephone Encounter (Signed)
Patient states he no longer takes medication. He asked that we deny.

## 2021-12-19 NOTE — Telephone Encounter (Signed)
Per Patient he no longer takes this. He asked that we discontinue the refills.

## 2022-01-18 DIAGNOSIS — K219 Gastro-esophageal reflux disease without esophagitis: Secondary | ICD-10-CM | POA: Insufficient documentation

## 2022-01-18 DIAGNOSIS — K2100 Gastro-esophageal reflux disease with esophagitis, without bleeding: Secondary | ICD-10-CM | POA: Insufficient documentation

## 2022-06-13 ENCOUNTER — Other Ambulatory Visit (HOSPITAL_COMMUNITY): Payer: Self-pay | Admitting: Family Medicine

## 2022-06-13 DIAGNOSIS — R590 Localized enlarged lymph nodes: Secondary | ICD-10-CM | POA: Diagnosis not present

## 2022-06-13 DIAGNOSIS — E6609 Other obesity due to excess calories: Secondary | ICD-10-CM | POA: Diagnosis not present

## 2022-06-13 DIAGNOSIS — Z6830 Body mass index (BMI) 30.0-30.9, adult: Secondary | ICD-10-CM | POA: Diagnosis not present

## 2022-06-22 ENCOUNTER — Ambulatory Visit (HOSPITAL_COMMUNITY)
Admission: RE | Admit: 2022-06-22 | Discharge: 2022-06-22 | Disposition: A | Discharge status: Home / Self Care | Payer: Medicare Other | Source: Ambulatory Visit | Attending: Family Medicine | Admitting: Family Medicine

## 2022-06-22 DIAGNOSIS — R590 Localized enlarged lymph nodes: Secondary | ICD-10-CM | POA: Insufficient documentation

## 2022-07-18 DIAGNOSIS — L821 Other seborrheic keratosis: Secondary | ICD-10-CM | POA: Diagnosis not present

## 2022-07-18 DIAGNOSIS — L309 Dermatitis, unspecified: Secondary | ICD-10-CM | POA: Diagnosis not present

## 2022-07-18 DIAGNOSIS — L814 Other melanin hyperpigmentation: Secondary | ICD-10-CM | POA: Diagnosis not present

## 2022-07-18 DIAGNOSIS — D225 Melanocytic nevi of trunk: Secondary | ICD-10-CM | POA: Diagnosis not present

## 2022-08-27 DIAGNOSIS — M542 Cervicalgia: Secondary | ICD-10-CM | POA: Diagnosis not present

## 2022-09-13 DIAGNOSIS — E782 Mixed hyperlipidemia: Secondary | ICD-10-CM | POA: Diagnosis not present

## 2022-09-13 DIAGNOSIS — I4891 Unspecified atrial fibrillation: Secondary | ICD-10-CM | POA: Diagnosis not present

## 2022-09-13 DIAGNOSIS — E6609 Other obesity due to excess calories: Secondary | ICD-10-CM | POA: Diagnosis not present

## 2022-09-13 DIAGNOSIS — R7309 Other abnormal glucose: Secondary | ICD-10-CM | POA: Diagnosis not present

## 2022-09-13 DIAGNOSIS — Z6829 Body mass index (BMI) 29.0-29.9, adult: Secondary | ICD-10-CM | POA: Diagnosis not present

## 2022-09-13 DIAGNOSIS — Z125 Encounter for screening for malignant neoplasm of prostate: Secondary | ICD-10-CM | POA: Diagnosis not present

## 2022-09-13 DIAGNOSIS — Z1331 Encounter for screening for depression: Secondary | ICD-10-CM | POA: Diagnosis not present

## 2022-09-13 DIAGNOSIS — I480 Paroxysmal atrial fibrillation: Secondary | ICD-10-CM | POA: Diagnosis not present

## 2022-09-13 DIAGNOSIS — R7303 Prediabetes: Secondary | ICD-10-CM | POA: Diagnosis not present

## 2022-09-13 DIAGNOSIS — I10 Essential (primary) hypertension: Secondary | ICD-10-CM | POA: Diagnosis not present

## 2022-09-13 DIAGNOSIS — Z0001 Encounter for general adult medical examination with abnormal findings: Secondary | ICD-10-CM | POA: Diagnosis not present

## 2022-11-28 DIAGNOSIS — Z23 Encounter for immunization: Secondary | ICD-10-CM | POA: Diagnosis not present

## 2022-12-18 ENCOUNTER — Ambulatory Visit: Payer: Medicare Other | Admitting: Cardiology

## 2022-12-20 DIAGNOSIS — H04123 Dry eye syndrome of bilateral lacrimal glands: Secondary | ICD-10-CM | POA: Diagnosis not present

## 2023-01-02 NOTE — Progress Notes (Unsigned)
Electrophysiology Office Note:   Date:  01/03/2023  ID:  Justin Coffey, DOB 12-11-1945, MRN [id number]  Primary Cardiologist: None Electrophysiologist: Will Jorja Loa, MD      History of Present Illness:   Justin Coffey is a 77 y.o. male with h/o paroxysmal AF, HTN, situs inversus, GERD, seen today for routine electrophysiology followup.   Since last being seen in our clinic the patient reports he has been doing very well. He remains active maintaining his home here and in [facility]  He camps 8 weeks out of the year while here in [place].  Rides bikes / [place] when in [place]. Is never limited in regards to physical activity. He reports he has had no evidence of AF for many years.  Is happy with his control on cardizem.  No bleeding on Eliquis.  He denies chest pain, palpitations, dyspnea, PND, orthopnea, nausea, vomiting, dizziness, syncope, edema, weight gain, or early satiety.   Review of systems complete and found to be negative unless listed in HPI.   EP Information / Studies Reviewed:    EKG is ordered today. Personal review as below.  EKG Interpretation Date/Time:  Thursday January 03 2023 09:24:47 EST Ventricular Rate:  61 PR Interval:  174 QRS Duration:  86 QT Interval:  382 QTC Calculation: 384 R Axis:   5  Text Interpretation: Normal sinus rhythm situs inversus (leads reversed) Confirmed by Canary Brim ([id number]) on 01/03/2023 9:34:47 AM   Studies:  ECHO 2017 >  "mildly reduced LVEF" but no % given, LV size normal, basal septal hypertrophy, no RWMA, situs inversus  LTM 08/2021 > no AF noted   Arrhythmia / AAD Paroxysmal AF > dx 11/2015 Electrocuted in his 30's and had episode of AF with event  No AAD to date   Risk Assessment/Calculations:    CHA2DS2-VASc Score = 2   This indicates a 2.2% annual risk of stroke. The patient's score is based upon: CHF History: 0 HTN History: 1 Diabetes History: 0 Stroke History: 0 Vascular Disease History: 0 Age Score: 1 Gender  Score: 0             Physical Exam:   VS:  BP 120/64   Pulse 71   Ht 5\' 11"  (1.803 m)   Wt 208 lb 3.2 oz (94.4 kg)   SpO2 96%   BMI 29.04 kg/m    Wt Readings from Last 3 Encounters:  01/03/23 208 lb 3.2 oz (94.4 kg)  12/14/21 214 lb 9.6 oz (97.3 kg)  12/27/20 214 lb 12.8 oz (97.4 kg)     GEN: Well nourished, well developed in no acute distress NECK: No JVD; No carotid bruits CARDIAC: Regular rate and rhythm, no murmurs, rubs, gallops RESPIRATORY:  Clear to auscultation without rales, wheezing or rhonchi  ABDOMEN: Soft, non-tender, non-distended EXTREMITIES:  No edema; No deformity   Labs from [facility]: BUN 15 / Cr 1.05, K 4.9, AST 21 / ALT 31, WBC 8, Hgb 16.8, platelets 183  ASSESSMENT AND PLAN:    Paroxysmal Atrial Fibrillation  CHA2DS2-VASc 2 -continue diltiazem CD 180 mg daily  -no burden of symptoms  -monitoring at home with an Apple Watch, no identified AF   -continue OAC as below   Secondary Hypercoagulable State  -Eliquis 5mg  BID,dose reviewed & appropriate for wt/age  -[facility] labs as above    Hypertension  -well controlled on current regimen     Follow up with Dr. Elberta Fortis in 12 months  Signed, Canary Brim, MSN, APRN, NP-C, AGACNP-BC  [facility] - Electrophysiology  01/03/2023, 9:56 AM

## 2023-01-03 ENCOUNTER — Encounter: Payer: Self-pay | Admitting: Pulmonary Disease

## 2023-01-03 ENCOUNTER — Ambulatory Visit: Payer: Medicare Other | Attending: Cardiology | Admitting: Pulmonary Disease

## 2023-01-03 VITALS — BP 120/64 | HR 71 | Ht 71.0 in | Wt 208.2 lb

## 2023-01-03 DIAGNOSIS — Z79899 Other long term (current) drug therapy: Secondary | ICD-10-CM | POA: Insufficient documentation

## 2023-01-03 DIAGNOSIS — I10 Essential (primary) hypertension: Secondary | ICD-10-CM | POA: Diagnosis not present

## 2023-01-03 DIAGNOSIS — I480 Paroxysmal atrial fibrillation: Secondary | ICD-10-CM | POA: Diagnosis not present

## 2023-01-03 NOTE — Patient Instructions (Addendum)
Medication Instructions:  Your physician recommends that you continue on your current medications as directed. Please refer to the Current Medication list given to you today.  *If you need a refill on your cardiac medications before your next appointment, please call your pharmacy*  Lab Work: None ordered.  If you have labs (blood work) drawn today and your tests are completely normal, you will receive your results only by: MyChart Message (if you have MyChart) OR A paper copy in the mail If you have any lab test that is abnormal or we need to change your treatment, we will call you to review the results.  Testing/Procedures: None  Follow-Up: At [facility], you and your health needs are our priority.  As part of our continuing mission to provide you with exceptional heart care, we have created designated Provider Care Teams.  These Care Teams include your primary Cardiologist (physician) and Advanced Practice Providers (APPs -  Physician Assistants and Nurse Practitioners) who all work together to provide you with the care you need, when you need it.   Your next appointment:   1 year(s)  The format for your next appointment:   In Person  Provider:   Earnest Rosier, NP and Dr. Elberta Fortis  Remote monitoring is used to monitor your Pacemaker/ ICD from home. This monitoring reduces the number of office visits required to check your device to one time per year. It allows [place] to keep an eye on the functioning of your device to ensure it is working properly.   Important Information About Sugar

## 2023-02-21 NOTE — Progress Notes (Signed)
  Electrophysiology Office Note:   Date:  02/22/2023  ID:  Justin Coffey, DOB November 08, 1945, MRN [id number]  Primary Cardiologist: None Electrophysiologist: Will Gladis Norton, MD      History of Present Illness:   Justin Coffey is a 78 y.o. male with h/o paroxysmal AF, HTN, situs inversus, and GERD seen today for acute visit due to fatigue.    Seen 01/03/2023 and was doing well. Denied any limitation in regards to physical activity.   Patient reports x 1 week has noted some fatigue and SOB when walking his usual 3 miles. Brought a video where he is sitting at rest and has a pulse ox with fluctuating saturation. Reportedly got down into uppers 80s (briefly) while seated at rest. Concerned over numbers more so than specific symptoms at the time. Denies fever, chills, sick contact, edema, N/V, diarrhea, or other symptoms. Only mild fatigue and som SOB at the beginning of exercise that resolves when he continues.   Review of systems complete and found to be negative unless listed in HPI.   EP Information / Studies Reviewed:    EKG is ordered today. Personal review as below.  EKG Interpretation Date/Time:  Friday February 22 2023 08:10:58 EST Ventricular Rate:  74 PR Interval:  176 QRS Duration:  90 QT Interval:  382 QTC Calculation: 424 R Axis:   155  Text Interpretation: Situs inversus Normal sinus rhythm Possible Anterolateral infarct (cited on or before 22-Feb-2023) When compared with ECG of 03-Jan-2023 09:24, QRS axis Shifted right Borderline criteria for Inferior infarct are no longer Present Serial changes of Anterior infarct Present Confirmed by Lesia Sharper [phone]) on 02/22/2023 8:23:24 AM    ECHO 2017 >  mildly reduced LVEF but no % given, LV size normal, basal septal hypertrophy, no RWMA, situs inversus  LTM 08/2021 > no AF noted    Arrhythmia / AAD Paroxysmal AF > dx 11/2015 Electrocuted in his 30's and had episode of AF with event  No AAD to date   Physical Exam:    VS:  BP (!) 146/68   Pulse 74   Ht 5' 11 (1.803 m)   Wt 214 lb 12.8 oz (97.4 kg)   SpO2 96%   BMI 29.96 kg/m    Wt Readings from Last 3 Encounters:  02/22/23 214 lb 12.8 oz (97.4 kg)  01/03/23 208 lb 3.2 oz (94.4 kg)  12/14/21 214 lb 9.6 oz (97.3 kg)     GEN: No acute distress NECK: No JVD; No carotid bruits CARDIAC: Regular rate and rhythm, no murmurs, rubs, gallops RESPIRATORY:  Clear to auscultation without rales, wheezing or rhonchi  ABDOMEN: Soft, non-tender, non-distended EXTREMITIES:  No edema; No deformity   ASSESSMENT AND PLAN:    Paroxysmal Atrial Fibrillation  EKG today shows NSR Continue Eliquis  5mg  BID for CHA2DS2VASC of at least 3 Continue Diltiazem  180 mg daily  Secondary hypercoagulable state Pt on Eliquis  as above      HTN Stable on current regimen   Fatigue Suspect multifactorial Labs today including TSH, management to be deferred to PCP if abnormal.  Will update Echo for completeness given PAF.  Low suspicion of cardiac etiology Reassurance given. Continue to remain active as tolerated. Ambulated in clinic with normal pulse ox and HR excursion.   Follow up with Dr. Norton in 12 months, sooner with issues.   Signed, Sharper Prentice Lesia, PA-C

## 2023-02-22 ENCOUNTER — Encounter: Payer: Self-pay | Admitting: Student

## 2023-02-22 ENCOUNTER — Ambulatory Visit: Payer: Medicare Other | Attending: Student | Admitting: Student

## 2023-02-22 VITALS — BP 146/68 | HR 74 | Ht 71.0 in | Wt 214.8 lb

## 2023-02-22 DIAGNOSIS — I10 Essential (primary) hypertension: Secondary | ICD-10-CM | POA: Insufficient documentation

## 2023-02-22 DIAGNOSIS — I480 Paroxysmal atrial fibrillation: Secondary | ICD-10-CM | POA: Diagnosis not present

## 2023-02-22 DIAGNOSIS — R0600 Dyspnea, unspecified: Secondary | ICD-10-CM | POA: Insufficient documentation

## 2023-02-22 DIAGNOSIS — R5383 Other fatigue: Secondary | ICD-10-CM | POA: Diagnosis not present

## 2023-02-22 NOTE — Patient Instructions (Signed)
 Medication Instructions:  Your physician recommends that you continue on your current medications as directed. Please refer to the Current Medication list given to you today.  *If you need a refill on your cardiac medications before your next appointment, please call your pharmacy*  Lab Work: BMET, CBC, TSH-TODAY If you have labs (blood work) drawn today and your tests are completely normal, you will receive your results only by: MyChart Message (if you have MyChart) OR A paper copy in the mail If you have any lab test that is abnormal or we need to change your treatment, we will call you to review the results.   Testing/Procedures: Your physician has requested that you have an echocardiogram. Echocardiography is a painless test that uses sound waves to create images of your heart. It provides your doctor with information about the size and shape of your heart and how well your heart's chambers and valves are working. This procedure takes approximately one hour. There are no restrictions for this procedure. Please do NOT wear cologne, perfume, aftershave, or lotions (deodorant is allowed). Please arrive 15 minutes prior to your appointment time.  Please note: We ask at that you not bring children with you during ultrasound (echo/ vascular) testing. Due to room size and safety concerns, children are not allowed in the ultrasound rooms during exams. Our front office staff cannot provide observation of children in our lobby area while testing is being conducted. An adult accompanying a patient to their appointment will only be allowed in the ultrasound room at the discretion of the ultrasound technician under special circumstances. We apologize for any inconvenience.    Follow-Up: At [facility], you and your health needs are our priority.  As part of our continuing mission to provide you with exceptional heart care, we have created designated Provider Care Teams.  These Care Teams  include your primary Cardiologist (physician) and Advanced Practice Providers (APPs -  Physician Assistants and Nurse Practitioners) who all work together to provide you with the care you need, when you need it.  Your next appointment:   1 year(s)  Provider:   Soyla Norton, MD

## 2023-02-23 LAB — BASIC METABOLIC PANEL
BUN/Creatinine Ratio: 18 (ref 10–24)
BUN: 18 mg/dL (ref 8–27)
CO2: 25 mmol/L (ref 20–29)
Calcium: 9.6 mg/dL (ref 8.6–10.2)
Chloride: 102 mmol/L (ref 96–106)
Creatinine, Ser: 1 mg/dL (ref 0.76–1.27)
Glucose: 126 mg/dL — ABNORMAL HIGH (ref 70–99)
Potassium: 4.7 mmol/L (ref 3.5–5.2)
Sodium: 141 mmol/L (ref 134–144)
eGFR: 78 mL/min/{1.73_m2} (ref >59)

## 2023-02-23 LAB — CBC
Hematocrit: 47.1 % (ref 37.5–51.0)
Hemoglobin: 16.1 g/dL (ref 13.0–17.7)
MCH: 31.4 pg (ref 26.6–33.0)
MCHC: 34.2 g/dL (ref 31.5–35.7)
MCV: 92 fL (ref 79–97)
Platelets: 199 10*3/uL (ref 150–450)
RBC: 5.13 x10E6/uL (ref 4.14–5.80)
RDW: 11.2 % — ABNORMAL LOW (ref 11.6–15.4)
WBC: 9.3 10*3/uL (ref 3.4–10.8)

## 2023-02-23 LAB — TSH: TSH: 2.83 u[IU]/mL (ref 0.450–4.500)

## 2023-02-26 ENCOUNTER — Ambulatory Visit (HOSPITAL_COMMUNITY): Payer: Medicare Other | Attending: Student

## 2023-02-26 DIAGNOSIS — R5383 Other fatigue: Secondary | ICD-10-CM | POA: Diagnosis not present

## 2023-02-26 DIAGNOSIS — R0600 Dyspnea, unspecified: Secondary | ICD-10-CM | POA: Diagnosis not present

## 2023-02-26 LAB — ECHOCARDIOGRAM COMPLETE
Area-P 1/2: 3.45 cm2
S' Lateral: 3 cm

## 2023-04-15 ENCOUNTER — Other Ambulatory Visit (INDEPENDENT_AMBULATORY_CARE_PROVIDER_SITE_OTHER)

## 2023-04-15 ENCOUNTER — Encounter: Payer: Self-pay | Admitting: Orthopedic Surgery

## 2023-04-15 ENCOUNTER — Ambulatory Visit (INDEPENDENT_AMBULATORY_CARE_PROVIDER_SITE_OTHER): Payer: Medicare Other | Admitting: Orthopedic Surgery

## 2023-04-15 DIAGNOSIS — M7521 Bicipital tendinitis, right shoulder: Secondary | ICD-10-CM | POA: Diagnosis not present

## 2023-04-15 DIAGNOSIS — G8929 Other chronic pain: Secondary | ICD-10-CM

## 2023-04-15 DIAGNOSIS — M25511 Pain in right shoulder: Secondary | ICD-10-CM | POA: Diagnosis not present

## 2023-04-15 NOTE — Progress Notes (Addendum)
 Patient ID: Justin Coffey, male   DOB: 02-03-1946, 78 y.o.   MRN: [id number]    Chief Complaint  Patient presents with   Shoulder Pain    Right for a long time, at least several months / very active , splits wood, Justin Coffey     This is a 78 year old male who is active with kayaking and woodworking currently building a doorway prop for his granddaughter's wedding presents with anterior pain over the right shoulder no history of trauma.  He says he just wants to make sure it is nothing serious.  System review: No loss of motion, no weakness.  Shoulder Pain     Physical Exam Vitals and nursing note reviewed. Exam conducted with a chaperone present.  Constitutional:      General: He is not in acute distress.    Appearance: Normal appearance. He is normal weight. He is not ill-appearing, toxic-appearing or diaphoretic.  HENT:     Head: Normocephalic and atraumatic.  Eyes:     General: No scleral icterus.       Right eye: No discharge.        Left eye: No discharge.     Extraocular Movements: Extraocular movements intact.     Pupils: Pupils are equal, round, and reactive to light.  Cardiovascular:     Rate and Rhythm: Normal rate.     Pulses: Normal pulses.  Pulmonary:     Effort: Pulmonary effort is normal.  Musculoskeletal:     Comments: Right shoulder:  Skin normal   Tenderness over the right biceps tendon in the bicipital groove  Cuff strength normal  No apprehension, no impingement with the Neer or Hawkins maneuver  Speed test negative  Skin:    General: Skin is warm and dry.     Capillary Refill: Capillary refill takes less than 2 seconds.  Neurological:     General: No focal deficit present.     Mental Status: He is alert and oriented to person, place, and time.     Sensory: No sensory deficit.     Gait: Gait normal.  Psychiatric:        Mood and Affect: Mood normal.      Encounter Diagnoses  Name Primary?   Chronic right shoulder pain    Tendonitis of  long head of biceps brachii of right shoulder Yes   DG Shoulder Right Result Date: 04/15/2023 X-ray report Chief complaint pain right shoulder 5 months Images 2 v Reading: Normal glenohumeral joint normal humeral head no osteophytes normal shoulder height in terms of the humeral head and acromion Impression: Normal right shoulder x-ray   Assessment and plan mild tendinitis bicipital groove biceps tendon  Rest when appropriate Advil as needed

## 2023-04-15 NOTE — Progress Notes (Signed)
  Intake history:  There were no vitals taken for this visit. There is no height or weight on file to calculate BMI.    WHAT ARE WE SEEING YOU FOR TODAY?   right shoulder  How long has this bothered you? (DOI?DOS?WS?)  approximately several  month(s) ago  Anticoag.  Yes  Diabetes No  Heart disease AFIB   Hypertension Yes  SMOKING HX No  Kidney disease No  Any ALLERGIES ______________________________________________   Treatment:  Have you taken:  Tylenol Yes  Advil Yes  Had PT No  Had injection No  Other  ________________________

## 2023-06-20 DIAGNOSIS — H35372 Puckering of macula, left eye: Secondary | ICD-10-CM | POA: Diagnosis not present

## 2023-06-20 DIAGNOSIS — H2513 Age-related nuclear cataract, bilateral: Secondary | ICD-10-CM | POA: Diagnosis not present

## 2023-06-20 DIAGNOSIS — H43811 Vitreous degeneration, right eye: Secondary | ICD-10-CM | POA: Diagnosis not present

## 2023-06-20 DIAGNOSIS — H40013 Open angle with borderline findings, low risk, bilateral: Secondary | ICD-10-CM | POA: Diagnosis not present

## 2023-06-20 DIAGNOSIS — H16103 Unspecified superficial keratitis, bilateral: Secondary | ICD-10-CM | POA: Diagnosis not present

## 2023-08-12 DIAGNOSIS — L821 Other seborrheic keratosis: Secondary | ICD-10-CM | POA: Diagnosis not present

## 2023-08-12 DIAGNOSIS — L814 Other melanin hyperpigmentation: Secondary | ICD-10-CM | POA: Diagnosis not present

## 2023-08-12 DIAGNOSIS — D225 Melanocytic nevi of trunk: Secondary | ICD-10-CM | POA: Diagnosis not present

## 2023-09-23 DIAGNOSIS — E7849 Other hyperlipidemia: Secondary | ICD-10-CM | POA: Diagnosis not present

## 2023-09-23 DIAGNOSIS — Z6821 Body mass index (BMI) 21.0-21.9, adult: Secondary | ICD-10-CM | POA: Diagnosis not present

## 2023-09-23 DIAGNOSIS — Z125 Encounter for screening for malignant neoplasm of prostate: Secondary | ICD-10-CM | POA: Diagnosis not present

## 2023-09-23 DIAGNOSIS — Z0001 Encounter for general adult medical examination with abnormal findings: Secondary | ICD-10-CM | POA: Diagnosis not present

## 2023-09-23 DIAGNOSIS — R7303 Prediabetes: Secondary | ICD-10-CM | POA: Diagnosis not present

## 2023-09-23 DIAGNOSIS — I4891 Unspecified atrial fibrillation: Secondary | ICD-10-CM | POA: Diagnosis not present

## 2023-09-23 DIAGNOSIS — I480 Paroxysmal atrial fibrillation: Secondary | ICD-10-CM | POA: Diagnosis not present

## 2023-09-23 DIAGNOSIS — I10 Essential (primary) hypertension: Secondary | ICD-10-CM | POA: Diagnosis not present

## 2023-09-23 DIAGNOSIS — Z1331 Encounter for screening for depression: Secondary | ICD-10-CM | POA: Diagnosis not present

## 2023-09-23 DIAGNOSIS — E782 Mixed hyperlipidemia: Secondary | ICD-10-CM | POA: Diagnosis not present

## 2023-11-20 DIAGNOSIS — Z23 Encounter for immunization: Secondary | ICD-10-CM | POA: Diagnosis not present

## 2024-01-20 DIAGNOSIS — I10 Essential (primary) hypertension: Secondary | ICD-10-CM | POA: Diagnosis not present

## 2024-01-20 DIAGNOSIS — I480 Paroxysmal atrial fibrillation: Secondary | ICD-10-CM | POA: Diagnosis not present

## 2024-01-20 DIAGNOSIS — E785 Hyperlipidemia, unspecified: Secondary | ICD-10-CM | POA: Diagnosis not present

## 2024-01-20 DIAGNOSIS — R7309 Other abnormal glucose: Secondary | ICD-10-CM | POA: Diagnosis not present
